# Patient Record
Sex: Female | Born: 1975 | Race: White | Hispanic: No | Marital: Married | Smoking: Never smoker
Health system: Southern US, Community
[De-identification: ages and names within clinical notes are randomized; demographics above are authoritative.]

## PROBLEM LIST (undated history)

## (undated) DIAGNOSIS — F32A Depression, unspecified: Secondary | ICD-10-CM

## (undated) DIAGNOSIS — N750 Cyst of Bartholin's gland: Secondary | ICD-10-CM

## (undated) DIAGNOSIS — F329 Major depressive disorder, single episode, unspecified: Secondary | ICD-10-CM

## (undated) DIAGNOSIS — N907 Vulvar cyst: Secondary | ICD-10-CM

## (undated) DIAGNOSIS — K219 Gastro-esophageal reflux disease without esophagitis: Secondary | ICD-10-CM

## (undated) DIAGNOSIS — R519 Headache, unspecified: Secondary | ICD-10-CM

## (undated) DIAGNOSIS — Z862 Personal history of diseases of the blood and blood-forming organs and certain disorders involving the immune mechanism: Secondary | ICD-10-CM

## (undated) DIAGNOSIS — R87613 High grade squamous intraepithelial lesion on cytologic smear of cervix (HGSIL): Secondary | ICD-10-CM

## (undated) DIAGNOSIS — F411 Generalized anxiety disorder: Secondary | ICD-10-CM

## (undated) DIAGNOSIS — F419 Anxiety disorder, unspecified: Secondary | ICD-10-CM

## (undated) DIAGNOSIS — S92909A Unspecified fracture of unspecified foot, initial encounter for closed fracture: Secondary | ICD-10-CM

## (undated) DIAGNOSIS — N390 Urinary tract infection, site not specified: Secondary | ICD-10-CM

## (undated) DIAGNOSIS — R51 Headache: Secondary | ICD-10-CM

## (undated) HISTORY — DX: Generalized anxiety disorder: F41.1

## (undated) HISTORY — DX: Unspecified fracture of unspecified foot, initial encounter for closed fracture: S92.909A

## (undated) HISTORY — DX: Cyst of Bartholin's gland: N75.0

## (undated) HISTORY — DX: Headache: R51

## (undated) HISTORY — DX: Headache, unspecified: R51.9

## (undated) HISTORY — DX: Major depressive disorder, single episode, unspecified: F32.9

## (undated) HISTORY — DX: Depression, unspecified: F32.A

## (undated) HISTORY — DX: Anxiety disorder, unspecified: F41.9

## (undated) HISTORY — DX: Gastro-esophageal reflux disease without esophagitis: K21.9

## (undated) HISTORY — DX: Vulvar cyst: N90.7

## (undated) HISTORY — DX: Personal history of diseases of the blood and blood-forming organs and certain disorders involving the immune mechanism: Z86.2

## (undated) HISTORY — DX: Urinary tract infection, site not specified: N39.0

---

## 2002-01-15 ENCOUNTER — Other Ambulatory Visit: Admission: RE | Admit: 2002-01-15 | Discharge: 2002-01-15 | Payer: Self-pay | Admitting: Unknown Physician Specialty

## 2008-12-31 ENCOUNTER — Encounter: Payer: Self-pay | Admitting: Obstetrics & Gynecology

## 2008-12-31 ENCOUNTER — Ambulatory Visit (HOSPITAL_COMMUNITY): Admission: RE | Admit: 2008-12-31 | Discharge: 2008-12-31 | Payer: Self-pay | Admitting: Obstetrics & Gynecology

## 2009-04-30 ENCOUNTER — Emergency Department (HOSPITAL_COMMUNITY): Admission: EM | Admit: 2009-04-30 | Discharge: 2009-04-30 | Payer: Self-pay | Admitting: Emergency Medicine

## 2009-09-06 ENCOUNTER — Emergency Department (HOSPITAL_COMMUNITY): Admission: EM | Admit: 2009-09-06 | Discharge: 2009-09-06 | Payer: Self-pay | Admitting: Family Medicine

## 2010-05-02 ENCOUNTER — Inpatient Hospital Stay (HOSPITAL_COMMUNITY): Admission: AD | Admit: 2010-05-02 | Discharge: 2010-05-02 | Payer: Self-pay | Admitting: Obstetrics & Gynecology

## 2010-05-28 ENCOUNTER — Other Ambulatory Visit: Admission: RE | Admit: 2010-05-28 | Discharge: 2010-05-28 | Payer: Self-pay | Admitting: Obstetrics & Gynecology

## 2010-05-28 ENCOUNTER — Ambulatory Visit (HOSPITAL_COMMUNITY): Admission: RE | Admit: 2010-05-28 | Discharge: 2010-05-28 | Payer: Self-pay | Admitting: Family Medicine

## 2010-06-14 ENCOUNTER — Ambulatory Visit (HOSPITAL_COMMUNITY)
Admission: RE | Admit: 2010-06-14 | Discharge: 2010-06-14 | Payer: Self-pay | Source: Home / Self Care | Admitting: Family Medicine

## 2010-09-19 HISTORY — PX: CYSTECTOMY: SHX5119

## 2010-10-06 LAB — URINALYSIS, ROUTINE W REFLEX MICROSCOPIC
Bilirubin Urine: NEGATIVE
Hgb urine dipstick: NEGATIVE
Ketones, ur: NEGATIVE mg/dL
Nitrite: NEGATIVE
Protein, ur: NEGATIVE mg/dL
Specific Gravity, Urine: 1.02 (ref 1.005–1.030)
Urine Glucose, Fasting: NEGATIVE mg/dL
Urobilinogen, UA: 0.2 mg/dL (ref 0.0–1.0)
pH: 7.5 (ref 5.0–8.0)

## 2010-10-06 LAB — CBC
HCT: 38.3 % (ref 36.0–46.0)
Hemoglobin: 13.2 g/dL (ref 12.0–15.0)
MCH: 30.2 pg (ref 26.0–34.0)
MCHC: 34.5 g/dL (ref 30.0–36.0)
MCV: 87.6 fL (ref 78.0–100.0)
Platelets: 281 10*3/uL (ref 150–400)
RBC: 4.37 MIL/uL (ref 3.87–5.11)
RDW: 12.5 % (ref 11.5–15.5)
WBC: 6.3 10*3/uL (ref 4.0–10.5)

## 2010-10-06 LAB — COMPREHENSIVE METABOLIC PANEL
ALT: 12 U/L (ref 0–35)
AST: 20 U/L (ref 0–37)
Albumin: 4.3 g/dL (ref 3.5–5.2)
Alkaline Phosphatase: 43 U/L (ref 39–117)
BUN: 12 mg/dL (ref 6–23)
CO2: 27 mEq/L (ref 19–32)
Calcium: 9.8 mg/dL (ref 8.4–10.5)
Chloride: 105 mEq/L (ref 96–112)
Creatinine, Ser: 0.64 mg/dL (ref 0.4–1.2)
GFR calc Af Amer: >60 mL/min (ref >60)
GFR calc non Af Amer: >60 mL/min (ref >60)
Glucose, Bld: 76 mg/dL (ref 70–99)
Potassium: 4 mEq/L (ref 3.5–5.1)
Sodium: 140 mEq/L (ref 135–145)
Total Bilirubin: 0.4 mg/dL (ref 0.3–1.2)
Total Protein: 7.1 g/dL (ref 6.0–8.3)

## 2010-10-06 LAB — HCG, QUANTITATIVE, PREGNANCY: hCG, Beta Chain, Quant, S: <2 m[IU]/mL (ref <5)

## 2010-10-06 LAB — SURGICAL PCR SCREEN
MRSA, PCR: NEGATIVE
Staphylococcus aureus: NEGATIVE

## 2010-10-13 ENCOUNTER — Ambulatory Visit (HOSPITAL_COMMUNITY)
Admission: RE | Admit: 2010-10-13 | Discharge: 2010-10-13 | Payer: Self-pay | Source: Home / Self Care | Attending: Obstetrics & Gynecology | Admitting: Obstetrics & Gynecology

## 2010-11-02 NOTE — Op Note (Signed)
  Stacey Walton, Stacey Walton               ACCOUNT NO.:  [id number]  MEDICAL RECORD NO.:  [id number]          PATIENT TYPE:  AMB  LOCATION:  DAY                           FACILITY:  [facility]  PHYSICIAN:  Lazaro Arms, M.D.   DATE OF BIRTH:  [birth year]  DATE OF PROCEDURE:  10/13/2010 DATE OF DISCHARGE:  10/13/2010                              OPERATIVE REPORT   PREOPERATIVE DIAGNOSIS:  Recurrent left vulvar cyst.  POSTOPERATIVE DIAGNOSIS:  Recurrent left vulvar cyst.  PROCEDURE:  Left vulvar cyst excision with partial vulvectomy.  SURGEON:  Lazaro Arms, M.D.  ANESTHESIA:  General endotracheal anesthesia.  FINDINGS:  The patient in April of 2010 had a very large cyst of the vulva.  It was quite soft and watery, and I felt like it was a canal of no remnant cyst over the past 8 months or so the patient has developed a cyst more in the lower part with seem like a Bartholin.  However, when I got in there today, I think it was just more of some sort of vulvar remnant cyst, it did not dissect out like a Bartholin gland cyst and it was not infected.  DESCRIPTION OF OPERATION:  The patient was taken to the operating room, placed in the supine position where she underwent laryngeal mask airway. She was then placed in the lithotomy position, prepped and draped in the usual sterile fashion.  Incision was made over the left vulvar cyst, dissection was performed with scissors, and the cyst was removed completely intact.  Both manual and sharp dissection was performed, the branch of pudendal artery was encountered and it was tied off with double figure-of-eight sutures.  The cyst was removed in total.  A 3- layer closure was then performed for hemostasis and for defect closure. The procedure anatomically was good and was hemostatic.  The bladder was drained.  A pudendal block was placed.  The patient was awaken from anesthesia, taken to recovery room in good stable addition.  All counts were  correct.  She received Ancef and Toradol prophylactically.     Lazaro Arms, M.D.     Loraine Maple  D:  10/13/2010  T:  10/14/2010  Job:  [id number]  Electronically Signed by Duane Lope M.D. on 11/02/2010 04:58:51 PM

## 2010-12-29 LAB — URINE MICROSCOPIC-ADD ON

## 2010-12-29 LAB — COMPREHENSIVE METABOLIC PANEL
ALT: 14 U/L (ref 0–35)
AST: 19 U/L (ref 0–37)
Albumin: 4.2 g/dL (ref 3.5–5.2)
Alkaline Phosphatase: 47 U/L (ref 39–117)
BUN: 10 mg/dL (ref 6–23)
CO2: 27 mEq/L (ref 19–32)
Calcium: 9.7 mg/dL (ref 8.4–10.5)
Chloride: 107 mEq/L (ref 96–112)
Creatinine, Ser: 0.63 mg/dL (ref 0.4–1.2)
GFR calc Af Amer: >60 mL/min (ref >60)
GFR calc non Af Amer: >60 mL/min (ref >60)
Glucose, Bld: 87 mg/dL (ref 70–99)
Potassium: 4.4 mEq/L (ref 3.5–5.1)
Sodium: 139 mEq/L (ref 135–145)
Total Bilirubin: 0.7 mg/dL (ref 0.3–1.2)
Total Protein: 7.2 g/dL (ref 6.0–8.3)

## 2010-12-29 LAB — URINALYSIS, ROUTINE W REFLEX MICROSCOPIC
Bilirubin Urine: NEGATIVE
Glucose, UA: NEGATIVE mg/dL
Ketones, ur: NEGATIVE mg/dL
Leukocytes, UA: NEGATIVE
Nitrite: NEGATIVE
Protein, ur: NEGATIVE mg/dL
Specific Gravity, Urine: 1.015 (ref 1.005–1.030)
Urobilinogen, UA: 0.2 mg/dL (ref 0.0–1.0)
pH: 8 (ref 5.0–8.0)

## 2010-12-29 LAB — CBC
HCT: 38.6 % (ref 36.0–46.0)
Hemoglobin: 13.4 g/dL (ref 12.0–15.0)
MCHC: 34.6 g/dL (ref 30.0–36.0)
MCV: 87.8 fL (ref 78.0–100.0)
Platelets: 250 10*3/uL (ref 150–400)
RBC: 4.4 MIL/uL (ref 3.87–5.11)
RDW: 13.6 % (ref 11.5–15.5)
WBC: 5.9 10*3/uL (ref 4.0–10.5)

## 2010-12-29 LAB — HCG, QUANTITATIVE, PREGNANCY: hCG, Beta Chain, Quant, S: <2 m[IU]/mL (ref <5)

## 2011-02-01 NOTE — Op Note (Signed)
NAME:  Stacey Walton, Stacey Walton              ACCOUNT NO.:  [id number]   MEDICAL RECORD NO.:  [id number]          PATIENT TYPE:  AMB   LOCATION:  DAY                           FACILITY:  [facility]   PHYSICIAN:  Lazaro Arms, M.D.   DATE OF BIRTH:  1975-11-06   DATE OF PROCEDURE:  12/31/2008  DATE OF DISCHARGE:                               OPERATIVE REPORT   PREOPERATIVE DIAGNOSIS:  Left vulvar cyst.   POSTOPERATIVE DIAGNOSIS:  Left vulvar cyst except she had 2 left vulvar  cysts, one appeared to be a cyst of the canal of Nuck and the other one  looked to be may be a rudimentary Bartholin's, kind of hard to say was  it infected, but the primary cyst was a canal of Nuck cyst.   PROCEDURE:  Excision of vulvar cysts.   SURGEON:  Lazaro Arms, MD   ANESTHESIA:  Laryngeal mask airway.   FINDINGS:  The patient was known preoperatively to have an expanding  vulvar cyst intraoperatively.  It was smooth-walled and noninfected and  it had mucoid drainage.  She also had another small cyst, but separate  from that I could not tell if it was a Bartholin's or not, but it was in  that area, but that was not distinct, that was not the primary driver of  the surgery.   DESCRIPTION OF OPERATION:  The patient was taken to operating room,  placed in supine position where she underwent general laryngeal mask  airway, placed in dorsal supine position, prepped and draped in usual  sterile fashion.  Incision was made in the left vulva, it was dissected  sharply and bluntly, dissected the cyst out, and also the one inferior  to it.  It was unfortunately during the procedure I while dissecting it  medially, it was such thin tissue, I was trying not to cut the vagina,  but the cyst opened up and it was pure mucoid material filling it.  It  was dissected out completely as was the other cyst, which was probably  rudimentary Bartholin's, but it was enlarged and I closed the space up  in 3 layers, first with 0-Vicryl  and then 2 layers of 3-0 Monocryl.  The  incision of the medial side of the vulva and the vagina was also closed  with Monocryl without difficulty, came very close to the urethra as the  cyst really filled the entire left vulva.  There was good hemostasis  throughout the procedure.  The blood loss was 250 mL.  She received 1 g  Ancef prophylactically.  She was taken to recovery room in good stable  condition.  All counts were correct.      Lazaro Arms, M.D.  Electronically Signed    LHE/MEDQ  D:  12/31/2008  T:  01/01/2009  Job:  [id number]

## 2011-02-20 ENCOUNTER — Emergency Department (HOSPITAL_COMMUNITY): Payer: 59

## 2011-02-20 ENCOUNTER — Emergency Department (HOSPITAL_COMMUNITY)
Admission: EM | Admit: 2011-02-20 | Discharge: 2011-02-20 | Disposition: A | Discharge status: Home / Self Care | Payer: 59 | Attending: Emergency Medicine | Admitting: Emergency Medicine

## 2011-02-20 DIAGNOSIS — M7989 Other specified soft tissue disorders: Secondary | ICD-10-CM | POA: Insufficient documentation

## 2011-02-20 DIAGNOSIS — M79609 Pain in unspecified limb: Secondary | ICD-10-CM | POA: Insufficient documentation

## 2011-02-20 DIAGNOSIS — L03019 Cellulitis of unspecified finger: Secondary | ICD-10-CM | POA: Insufficient documentation

## 2011-08-30 ENCOUNTER — Other Ambulatory Visit (HOSPITAL_COMMUNITY)
Admission: RE | Admit: 2011-08-30 | Discharge: 2011-08-30 | Disposition: A | Discharge status: Home / Self Care | Payer: 59 | Source: Ambulatory Visit | Attending: Obstetrics & Gynecology | Admitting: Obstetrics & Gynecology

## 2011-08-30 ENCOUNTER — Other Ambulatory Visit: Payer: Self-pay | Admitting: Obstetrics & Gynecology

## 2011-08-30 DIAGNOSIS — Z01419 Encounter for gynecological examination (general) (routine) without abnormal findings: Secondary | ICD-10-CM | POA: Insufficient documentation

## 2011-12-06 ENCOUNTER — Other Ambulatory Visit: Payer: Self-pay | Admitting: Obstetrics & Gynecology

## 2011-12-06 DIAGNOSIS — D1720 Benign lipomatous neoplasm of skin and subcutaneous tissue of unspecified limb: Secondary | ICD-10-CM

## 2011-12-09 ENCOUNTER — Ambulatory Visit (HOSPITAL_COMMUNITY): Payer: 59

## 2012-01-04 ENCOUNTER — Ambulatory Visit (HOSPITAL_COMMUNITY)
Admission: RE | Admit: 2012-01-04 | Discharge: 2012-01-04 | Disposition: A | Discharge status: Home / Self Care | Payer: 59 | Source: Ambulatory Visit | Attending: Obstetrics & Gynecology | Admitting: Obstetrics & Gynecology

## 2012-01-04 ENCOUNTER — Other Ambulatory Visit: Payer: Self-pay | Admitting: Obstetrics & Gynecology

## 2012-01-04 DIAGNOSIS — D1720 Benign lipomatous neoplasm of skin and subcutaneous tissue of unspecified limb: Secondary | ICD-10-CM

## 2012-01-04 DIAGNOSIS — N63 Unspecified lump in unspecified breast: Secondary | ICD-10-CM | POA: Insufficient documentation

## 2012-01-08 ENCOUNTER — Emergency Department (HOSPITAL_COMMUNITY)
Admission: EM | Admit: 2012-01-08 | Discharge: 2012-01-08 | Disposition: A | Discharge status: Home / Self Care | Payer: 59 | Source: Home / Self Care | Attending: Emergency Medicine | Admitting: Emergency Medicine

## 2012-01-08 ENCOUNTER — Encounter (HOSPITAL_COMMUNITY): Payer: Self-pay | Admitting: *Deleted

## 2012-01-08 DIAGNOSIS — J069 Acute upper respiratory infection, unspecified: Secondary | ICD-10-CM

## 2012-01-08 DIAGNOSIS — J019 Acute sinusitis, unspecified: Secondary | ICD-10-CM

## 2012-01-08 MED ORDER — AMOXICILLIN-POT CLAVULANATE 875-125 MG PO TABS: 1.0000 | ORAL_TABLET | Freq: Two times a day (BID) | ORAL | Status: AC

## 2012-01-08 NOTE — ED Provider Notes (Signed)
Chief Complaint  Patient presents with  . Nasal Congestion  . Cough    History of Present Illness:   [name] is a 36 year old female who has had an 8 day history of nasal congestion with green drainage, headache, cough productive of thick green sputum, tightness in chest, and sore throat. She denies fever, chills, sinus pain or pressure, earache, wheezing, or GI symptoms. She has no history of allergies or asthma.  Review of Systems:  Other than noted above, the patient denies any of the following symptoms. Systemic:  No fever, chills, sweats, fatigue, myalgias, headache, or anorexia. Eye:  No redness,pain or drainage. ENT:  No earache, ear congestion, nasal congestion, sneezing, rhinorrhea, sinus pressure, sinus pain, post nasal drip, or sore throat. Lungs:  No cough, sputum production, wheezing, shortness of breath. Or chest pain. Skin:  No rash or itching.  PMFSH:  Past medical history, family history, social history, meds, and allergies were reviewed.  Physical Exam:   Vital signs:  BP 120/80  Pulse 57  Temp(Src) 98.5 F (36.9 C) (Oral)  Resp 15  SpO2 98%  LMP 01/03/2012 General:  Alert, in no distress. Eye:  No conjunctival injection or drainage. Lids were normal. ENT:  TMs and canals were normal, without erythema or inflammation.  Nasal mucosa was clear and uncongested, without drainage.  Mucous membranes were moist.  Pharynx was clear, without exudate or drainage.  There were no oral ulcerations or lesions. Neck:  Supple, no adenopathy, tenderness or mass. Lungs:  No respiratory distress.  Lungs were clear to auscultation, without wheezes, rales or rhonchi.  Breath sounds were clear and equal bilaterally. Heart:  Regular rhythm, without gallops, murmers or rubs. Skin:  Clear, warm, and dry, without rash or lesions.  Assessment:  The primary encounter diagnosis was Viral upper respiratory infection. A diagnosis of Acute sinus infection was also pertinent to this  visit.  Plan:   1.  The following meds were prescribed:   New Prescriptions   AMOXICILLIN-CLAVULANATE (AUGMENTIN) 875-125 MG PER TABLET    Take 1 tablet by mouth 2 (two) times daily.   2.  The patient was instructed in symptomatic care and handouts were given. 3.  The patient was told to return if becoming worse in any way, if no better in 3 or 4 days, and given some red flag symptoms that would indicate earlier return.   Reuben Likes, MD 01/08/12 1351

## 2012-01-08 NOTE — ED Notes (Signed)
Pt with c/o sinus congestion/headache  and productive cough x one week - denies fevers

## 2012-01-08 NOTE — Discharge Instructions (Signed)

## 2012-04-26 ENCOUNTER — Encounter: Payer: Self-pay | Admitting: Family Medicine

## 2012-04-26 ENCOUNTER — Ambulatory Visit (INDEPENDENT_AMBULATORY_CARE_PROVIDER_SITE_OTHER): Payer: 59 | Admitting: Family Medicine

## 2012-04-26 VITALS — BP 149/81 | HR 56 | Temp 96.7°F | Ht 69.0 in | Wt 156.0 lb

## 2012-04-26 DIAGNOSIS — F411 Generalized anxiety disorder: Secondary | ICD-10-CM

## 2012-04-26 MED ORDER — CLONAZEPAM 0.5 MG PO TABS: ORAL_TABLET | ORAL | Status: DC

## 2012-04-26 MED ORDER — PAROXETINE HCL 20 MG PO TABS: ORAL_TABLET | ORAL | Status: DC

## 2012-04-26 NOTE — Progress Notes (Signed)
Office Note 04/26/2012  CC:  Chief Complaint  Patient presents with  . Establish Care    nerve issues    HPI:  Stacey Walton is a 36 y.o. White female who is here to establish care. Patient's most recent primary MD:  [facility] in [place], [place]. Old records in [facility] EMR were reviewed prior to or during today's visit.  About 1 yr hx of anxiety and depression.  Wellbutrin from previous MD didn't help and it caused insomnia.   Says for the last couple of months things have been worseing: describes frequent crying spells, worries about everything, irritable and tense, sometimes gets so emotional she feels overwhelmed/panicky.  Wants to isolate.  Tends to sleep more than usual as a withdrawal behavior.  Can't concentrate well.  Restless. Appetite unchanged.  Energy level down just a little.  Has a 20 y/o stepdaughter at home and 94 y/o stepson at home.   This is affecting her relationship some with her husband.  She has not had to miss any work and her kids have not been impacted by it that she knows of.  Past Medical History  Diagnosis Date  . Bartholin gland cyst     Recurrent (left)  . Vulvar cyst     Excised 2012  . History of anemia   . Headache disorder     Tension in neck, occiput, tense shoulders.  No migrainous features.  Takes motrin and/or tylenol and it helps usually.  MRI neck and brain in the past normal (2009)    Past Surgical History  Procedure Date  . Cesarean section   . Cystectomy 2012    Vulvar (Dr. Despina Hidden)    Family History  Problem Relation Age of Onset  . Hypertension Father   . Transient ischemic attack Father     History   Social History  . Marital Status: Married    Spouse Name: N/A    Number of Children: N/A  . Years of Education: N/A   Occupational History  . Not on file.   Social History Main Topics  . Smoking status: Never Smoker   . Smokeless tobacco: Never Used  . Alcohol Use: No  . Drug Use: No  . Sexually  Active: Not on file   Other Topics Concern  . Not on file   Social History Narrative   Divorced, remarried, has one teenage son.She has an associates degree in nursing and works at [employer].No T/A/Ds.No significant exercise.Normal american diet.    Outpatient Encounter Prescriptions as of 04/26/2012  Medication Sig Dispense Refill  . LOESTRIN 24 FE 1-20 MG-MCG tablet Take 1 tablet by mouth Daily.      . clonazePAM (KLONOPIN) 0.5 MG tablet 1-2 tabs po q8h prn anxiety  15 tablet  1  . PARoxetine (PAXIL) 20 MG tablet 1 tab po qhs  30 tablet  1  . DISCONTD: ibuprofen (ADVIL,MOTRIN) 400 MG tablet Take 400 mg by mouth every 6 (six) hours as needed.      Marland Kitchen DISCONTD: loratadine (CLARITIN) 10 MG tablet Take 10 mg by mouth daily.      Marland Kitchen DISCONTD: Pseudoephedrine-DM-GG (SUDAFED COUGH PO) Take by mouth.        No Known Allergies  ROS Review of Systems  Constitutional: Negative for fever and fatigue.  HENT: Negative for congestion and sore throat.   Eyes: Negative for visual disturbance.  Respiratory: Negative for cough.   Cardiovascular: Negative for chest pain.  Gastrointestinal: Negative for nausea and abdominal  pain.  Genitourinary: Negative for dysuria.  Musculoskeletal: Negative for back pain and joint swelling.  Skin: Negative for rash.  Neurological: Positive for headaches (chronic/recurrent/stress-tension HA's). Negative for weakness.  Hematological: Negative for adenopathy.  Psychiatric/Behavioral: Negative for suicidal ideas.       See hpi    PE; Blood pressure 149/81, pulse 56, temperature 96.7 F (35.9 C), temperature source Temporal, height 5\' 9"  (1.753 m), weight 156 lb (70.761 kg), last menstrual period 03/24/2012, SpO2 99.00%. Gen: Alert, well appearing.  Patient is oriented to person, place, time, and situation. Affect: pleasant, gets tearful occasionally but never outright crying or losing control.   ENT: Ears: EACs clear, normal epithelium.  TMs with good light reflex and  landmarks bilaterally.  Eyes: no injection, icteris, swelling, or exudate.  EOMI, PERRLA. Nose: no drainage or turbinate edema/swelling.  No injection or focal lesion.  Mouth: lips without lesion/swelling.  Oral mucosa pink and moist.  Dentition intact and without obvious caries or gingival swelling.  Oropharynx without erythema, exudate, or swelling.  Neck - No masses or thyromegaly or limitation in range of motion CV: RRR, no m/r/g.   LUNGS: CTA bilat, nonlabored resps, good aeration in all lung fields. ABD: soft, NT, ND, BS normal.  No hepatospenomegaly or mass.  No bruits. EXT: no clubbing, cyanosis, or edema.   Pertinent labs:  none  ASSESSMENT AND PLAN:   New Patient:  GAD (generalized anxiety disorder) With prominent mood sx's that I think are secondary to her worsening anxiety disorder.  She does approach the panic level on occasion.  Start paxil 20mg  qd.  Therapeutic expectations and side effect profile of medication discussed today.  Patient's questions answered. Also start clonazepam 0.5mg , 1-2 q8h prn, #15, RF x 1.  Therapeutic expectations and side effect profile of medication discussed today.  Patient's questions answered. Discussed the possibility of counseling today and she says she'll think about it. F/u 1 mo in office, earlier if problems.    Return in about 1 month (around 05/27/2012) for f/u anxiety and depression.

## 2012-04-26 NOTE — Assessment & Plan Note (Addendum)
With prominent mood sx's that I think are secondary to her worsening anxiety disorder.  She does approach the panic level on occasion.  Start paxil 20mg  qd.  Therapeutic expectations and side effect profile of medication discussed today.  Patient's questions answered. Also start clonazepam 0.5mg , 1-2 q8h prn, #15, RF x 1.  Therapeutic expectations and side effect profile of medication discussed today.  Patient's questions answered. Discussed the possibility of counseling today and she says she'll think about it. F/u 1 mo in office, earlier if problems.

## 2012-05-31 ENCOUNTER — Ambulatory Visit (INDEPENDENT_AMBULATORY_CARE_PROVIDER_SITE_OTHER): Payer: 59 | Admitting: Family Medicine

## 2012-05-31 ENCOUNTER — Encounter: Payer: Self-pay | Admitting: Family Medicine

## 2012-05-31 VITALS — BP 125/78 | HR 66 | Ht 69.0 in | Wt 153.0 lb

## 2012-05-31 DIAGNOSIS — F411 Generalized anxiety disorder: Secondary | ICD-10-CM

## 2012-05-31 MED ORDER — PAROXETINE HCL 20 MG PO TABS: ORAL_TABLET | ORAL | Status: DC

## 2012-05-31 NOTE — Assessment & Plan Note (Signed)
Much improved.  Continue paxil 20mg  qd.  She has clonazepam to use prn but she has not needed this. F/u 6 mo.

## 2012-05-31 NOTE — Progress Notes (Signed)
OFFICE NOTE  05/31/2012  CC:  Chief Complaint  Patient presents with  . Follow-up    GAD     HPI: Patient is a 36 y.o. Caucasian female who is here for 1 mo f/u GAD, started paxil 1 mo ago plus prn clonazepam.  Doing well, improved 75% per her estimate.  She felt foggy mentally quite a bit for the first week on the med.  This has tapered off gradually and she feels no side effect now at all.  Initially had appetite suppression but this has worn off as well.  Pertinent PMH:  Past Medical History  Diagnosis Date  . Bartholin gland cyst     Recurrent (left)  . Vulvar cyst     Excised 2012  . History of anemia   . Headache disorder     Tension in neck, occiput, tense shoulders.  No migrainous features.  Takes motrin and/or tylenol and it helps usually.  MRI neck and brain in the past normal (2009)    MEDS:  Outpatient Prescriptions Prior to Visit  Medication Sig Dispense Refill  . LOESTRIN 24 FE 1-20 MG-MCG tablet Take 1 tablet by mouth Daily.      Marland Kitchen PARoxetine (PAXIL) 20 MG tablet 1 tab po qhs  30 tablet  1  . clonazePAM (KLONOPIN) 0.5 MG tablet 1-2 tabs po q8h prn anxiety  15 tablet  1    PE: Blood pressure 125/78, pulse 66, height 5\' 9"  (1.753 m), weight 153 lb (69.4 kg). Wt Readings from Last 2 Encounters:  05/31/12 153 lb (69.4 kg)  04/26/12 156 lb (70.761 kg)    Gen: alert, oriented x 4, affect pleasant.  Lucid thinking and conversation noted. HEENT: PERRLA, EOMI.   Neck: no LAD, mass, or thyromegaly. CV: RRR, no m/r/g LUNGS: CTA bilat, nonlabored. NEURO: no tremor or tics noted on observation.  Coordination intact. CN 2-12 grossly intact bilaterally, strength 5/5 in all extremeties.  No ataxia.   IMPRESSION AND PLAN:  GAD (generalized anxiety disorder) Much improved.  Continue paxil 20mg  qd.  She has clonazepam to use prn but she has not needed this. F/u 6 mo.     FOLLOW UP: 6 mo

## 2012-11-28 ENCOUNTER — Ambulatory Visit: Payer: 59 | Admitting: Family Medicine

## 2012-12-06 ENCOUNTER — Encounter (HOSPITAL_COMMUNITY): Payer: Self-pay | Admitting: *Deleted

## 2012-12-06 ENCOUNTER — Emergency Department (HOSPITAL_COMMUNITY)
Admission: EM | Admit: 2012-12-06 | Discharge: 2012-12-06 | Disposition: A | Discharge status: Home / Self Care | Payer: 59 | Attending: Emergency Medicine | Admitting: Emergency Medicine

## 2012-12-06 ENCOUNTER — Ambulatory Visit: Payer: 59 | Admitting: Family Medicine

## 2012-12-06 ENCOUNTER — Emergency Department (HOSPITAL_COMMUNITY): Payer: 59

## 2012-12-06 DIAGNOSIS — K2900 Acute gastritis without bleeding: Secondary | ICD-10-CM | POA: Insufficient documentation

## 2012-12-06 DIAGNOSIS — N390 Urinary tract infection, site not specified: Secondary | ICD-10-CM | POA: Insufficient documentation

## 2012-12-06 DIAGNOSIS — R630 Anorexia: Secondary | ICD-10-CM | POA: Insufficient documentation

## 2012-12-06 DIAGNOSIS — Z862 Personal history of diseases of the blood and blood-forming organs and certain disorders involving the immune mechanism: Secondary | ICD-10-CM | POA: Insufficient documentation

## 2012-12-06 DIAGNOSIS — Z3202 Encounter for pregnancy test, result negative: Secondary | ICD-10-CM | POA: Insufficient documentation

## 2012-12-06 DIAGNOSIS — R112 Nausea with vomiting, unspecified: Secondary | ICD-10-CM | POA: Insufficient documentation

## 2012-12-06 DIAGNOSIS — Z8742 Personal history of other diseases of the female genital tract: Secondary | ICD-10-CM | POA: Insufficient documentation

## 2012-12-06 LAB — COMPREHENSIVE METABOLIC PANEL
AST: 16 U/L (ref 0–37)
Albumin: 3.5 g/dL (ref 3.5–5.2)
Alkaline Phosphatase: 48 U/L (ref 39–117)
Chloride: 100 mEq/L (ref 96–112)
Creatinine, Ser: 0.63 mg/dL (ref 0.50–1.10)
Potassium: 3 mEq/L — ABNORMAL LOW (ref 3.5–5.1)
Total Bilirubin: 0.4 mg/dL (ref 0.3–1.2)

## 2012-12-06 LAB — CBC WITH DIFFERENTIAL/PLATELET
Hemoglobin: 13.2 g/dL (ref 12.0–15.0)
Lymphocytes Relative: 18 % (ref 12–46)
Lymphs Abs: 0.7 10*3/uL (ref 0.7–4.0)
MCH: 29.4 pg (ref 26.0–34.0)
Monocytes Relative: 9 % (ref 3–12)
Neutro Abs: 2.8 10*3/uL (ref 1.7–7.7)
Neutrophils Relative %: 72 % (ref 43–77)
RBC: 4.49 MIL/uL (ref 3.87–5.11)
WBC: 3.9 10*3/uL — ABNORMAL LOW (ref 4.0–10.5)

## 2012-12-06 LAB — URINALYSIS, ROUTINE W REFLEX MICROSCOPIC
Glucose, UA: NEGATIVE mg/dL
Ketones, ur: NEGATIVE mg/dL
pH: 6 (ref 5.0–8.0)

## 2012-12-06 LAB — URINE MICROSCOPIC-ADD ON

## 2012-12-06 MED ORDER — SULFAMETHOXAZOLE-TRIMETHOPRIM 800-160 MG PO TABS: 1.0000 | ORAL_TABLET | Freq: Two times a day (BID) | ORAL | Status: DC

## 2012-12-06 MED ORDER — OMEPRAZOLE 20 MG PO CPDR: 20.0000 mg | DELAYED_RELEASE_CAPSULE | Freq: Every day | ORAL | Status: DC

## 2012-12-06 MED ORDER — ALUM & MAG HYDROXIDE-SIMETH 500-450-40 MG/5ML PO SUSP: 10.0000 mL | Freq: Three times a day (TID) | ORAL | Status: DC

## 2012-12-06 NOTE — ED Provider Notes (Signed)
History     This chart was scribed for Stacey Human, MD, MD by Smitty Pluck, ED Scribe. The patient was seen in room APA19[facility] and the patient's care was started at 2:23 PM.   CSN: [id number]  Arrival date & time 12/06/12  1316      Chief Complaint  Patient presents with  . Abdominal Pain    The history is provided by the patient and medical records. No language interpreter was used.   Stacey Walton is a 37 y.o. female who presents to the Emergency Department complaining of intermittent, severe abdominal pain onset 1 day ago but worsening today. She states the pain comes every 15-20 minutes. Pt reports that she had constant, nausea and emesis. Pt mentions taking zantac without relief of abdominal pain. She states she recently started a new diet. She states she has decreased appetite. Pt denies fever, dysuria, chills, diarrhea, weakness, cough, SOB, syncope and any other pain. Her LMP was 1.5 weeks ago and normal. She reports hx of c-section and vaginal cyst removed. She denies smoking cigarettes. She drinks alcohol occasionally.   Reports family (dad) hx of diverticulitis.   Past Medical History  Diagnosis Date  . Bartholin gland cyst     Recurrent (left)  . Vulvar cyst     Excised 2012  . History of anemia   . Headache disorder     Tension in neck, occiput, tense shoulders.  No migrainous features.  Takes motrin and/or tylenol and it helps usually.  MRI neck and brain in the past normal (2009)    Past Surgical History  Procedure Laterality Date  . Cesarean section    . Cystectomy  2012    Vulvar (Dr. Despina Hidden)    Family History  Problem Relation Age of Onset  . Hypertension Father   . Transient ischemic attack Father     History  Substance Use Topics  . Smoking status: Never Smoker   . Smokeless tobacco: Never Used  . Alcohol Use: Yes     Comment: rarely    OB History   Grav Para Term Preterm Abortions TAB SAB Ect Mult Living                  Review of  Systems  Constitutional: Negative for fever and chills.  Gastrointestinal: Positive for nausea, vomiting and abdominal pain. Negative for diarrhea.  All other systems reviewed and are negative.    Allergies  Review of patient's allergies indicates no known allergies.  Home Medications   Current Outpatient Rx  Name  Route  Sig  Dispense  Refill  . promethazine (PHENERGAN) 25 MG tablet   Oral   Take 25 mg by mouth once as needed for nausea.         . ranitidine (ZANTAC) 150 MG tablet   Oral   Take 150 mg by mouth daily as needed for heartburn (and/or acid reflux).           BP 127/80  Pulse 91  Temp(Src) 98.1 F (36.7 C) (Oral)  Resp 20  Ht 5\' 9"  (1.753 m)  Wt 160 lb (72.576 kg)  BMI 23.62 kg/m2  SpO2 100%  LMP 11/29/2012  Physical Exam  Nursing note and vitals reviewed. Constitutional: She is oriented to person, place, and time. She appears well-developed and well-nourished. No distress.  HENT:  Head: Normocephalic and atraumatic.  Eyes: Conjunctivae and EOM are normal. Pupils are equal, round, and reactive to light.  Neck: Normal range of  motion. Neck supple. No tracheal deviation present.  Cardiovascular: Normal rate, regular rhythm and normal heart sounds.   No murmur heard. Pulmonary/Chest: Effort normal and breath sounds normal. No respiratory distress. She has no wheezes. She has no rales.  Abdominal: Soft. Bowel sounds are normal. She exhibits no distension and no mass. There is tenderness in the epigastric area. There is no rebound and no guarding.  Musculoskeletal: Normal range of motion.  Neurological: She is alert and oriented to person, place, and time.  Skin: Skin is warm and dry.  Psychiatric: She has a normal mood and affect. Her behavior is normal.    ED Course  Procedures (including critical care time) DIAGNOSTIC STUDIES: Oxygen Saturation is 100% on room air, normal by my interpretation.    COORDINATION OF CARE: 2:31 PM Discussed ED  treatment with pt and pt agrees.   4:45 PM Results for orders placed during the hospital encounter of 12/06/12  COMPREHENSIVE METABOLIC PANEL      Result Value Range   Sodium 135  135 - 145 mEq/L   Potassium 3.0 (*) 3.5 - 5.1 mEq/L   Chloride 100  96 - 112 mEq/L   CO2 23  19 - 32 mEq/L   Glucose, Bld 111 (*) 70 - 99 mg/dL   BUN 8  6 - 23 mg/dL   Creatinine, Ser 1.61  0.50 - 1.10 mg/dL   Calcium 9.1  8.4 - 09.6 mg/dL   Total Protein 7.4  6.0 - 8.3 g/dL   Albumin 3.5  3.5 - 5.2 g/dL   AST 16  0 - 37 U/L   ALT 9  0 - 35 U/L   Alkaline Phosphatase 48  39 - 117 U/L   Total Bilirubin 0.4  0.3 - 1.2 mg/dL   GFR calc non Af Amer >90  >90 mL/min   GFR calc Af Amer >90  >90 mL/min  LIPASE, BLOOD      Result Value Range   Lipase 22  11 - 59 U/L  URINALYSIS, ROUTINE W REFLEX MICROSCOPIC      Result Value Range   Color, Urine YELLOW  YELLOW   APPearance CLEAR  CLEAR   Specific Gravity, Urine >1.030 (*) 1.005 - 1.030   pH 6.0  5.0 - 8.0   Glucose, UA NEGATIVE  NEGATIVE mg/dL   Hgb urine dipstick LARGE (*) NEGATIVE   Bilirubin Urine NEGATIVE  NEGATIVE   Ketones, ur NEGATIVE  NEGATIVE mg/dL   Protein, ur TRACE (*) NEGATIVE mg/dL   Urobilinogen, UA 0.2  0.0 - 1.0 mg/dL   Nitrite NEGATIVE  NEGATIVE   Leukocytes, UA TRACE (*) NEGATIVE  PREGNANCY, URINE      Result Value Range   Preg Test, Ur NEGATIVE  NEGATIVE  CBC WITH DIFFERENTIAL      Result Value Range   WBC 3.9 (*) 4.0 - 10.5 K/uL   RBC 4.49  3.87 - 5.11 MIL/uL   Hemoglobin 13.2  12.0 - 15.0 g/dL   HCT 04.5  40.9 - 81.1 %   MCV 87.1  78.0 - 100.0 fL   MCH 29.4  26.0 - 34.0 pg   MCHC 33.8  30.0 - 36.0 g/dL   RDW 91.4  78.2 - 95.6 %   Platelets 217  150 - 400 K/uL   Neutrophils Relative 72  43 - 77 %   Neutro Abs 2.8  1.7 - 7.7 K/uL   Lymphocytes Relative 18  12 - 46 %   Lymphs Abs 0.7  0.7 - 4.0 K/uL   Monocytes Relative 9  3 - 12 %   Monocytes Absolute 0.3  0.1 - 1.0 K/uL   Eosinophils Relative 1  0 - 5 %   Eosinophils  Absolute 0.0  0.0 - 0.7 K/uL   Basophils Relative 0  0 - 1 %   Basophils Absolute 0.0  0.0 - 0.1 K/uL  URINE MICROSCOPIC-ADD ON      Result Value Range   Squamous Epithelial / LPF FEW (*) RARE   WBC, UA 11-20  <3 WBC/hpf   RBC / HPF 11-20  <3 RBC/hpf   Bacteria, UA FEW (*) RARE   US Abdomen Limited Ruq  12/06/2012  *RADIOLOGY REPORT*  Clinical Data:  Epigastric abdominal pain.  LIMITED ABDOMINAL ULTRASOUND - RIGHT UPPER QUADRANT  Comparison:  None  Findings:  Gallbladder:  Normal sonographic appearance of the gallbladder.  No gallstones, wall thickening or pericholecystic fluid.  Negative sonographic Murphy's sign.  Common bile duct:  Normal in caliber measuring a maximum of 3.92mm.  Liver:  The liver is sonographically unremarkable.  There is normal echogenicity without focal lesions or intrahepatic biliary dilatation.  IMPRESSION: Unremarkable right upper quadrant abdominal ultrasound examination.                    Original Report Authenticated By: Rudie Meyer, M.D.     Lab workup showed a UTI.  Will Rx for UTI with Bactrim DS, and for acute gastritis with Prilosec 20 mg qd, and antacids pc and hs.  F/U with Earma Reading, M.D, her family physician.  No work for 3 days.         1. Acute gastritis   2. Urinary tract infection      I personally performed the services described in this documentation, which was scribed in my presence. The recorded information has been reviewed and is accurate.  Stacey Human, MD      Carleene Cooper III, MD 12/06/12 [phone]

## 2012-12-06 NOTE — ED Notes (Signed)
Epigastric diet,  N/v

## 2012-12-06 NOTE — ED Notes (Signed)
Pt state intermittent squeezing pain to epigastric area. States she vomited x 1 last night. NAD. Pt asked to hold off on EKG until seen by EMD stating, "I don't feel like this is cardiac related."

## 2012-12-08 LAB — URINE CULTURE

## 2012-12-09 ENCOUNTER — Telehealth (HOSPITAL_COMMUNITY): Payer: Self-pay | Admitting: Emergency Medicine

## 2012-12-09 NOTE — ED Notes (Signed)
Patient has +Urine culture. Checking to see if treated appropriately. °

## 2012-12-09 NOTE — ED Notes (Signed)
+  Urine. Patient treated with Septra. Sensitive to same. Per protocol MD. °

## 2013-02-26 ENCOUNTER — Encounter: Payer: 59 | Admitting: Physical Therapy

## 2013-06-10 ENCOUNTER — Encounter: Payer: Self-pay | Admitting: Internal Medicine

## 2013-06-10 ENCOUNTER — Encounter: Payer: Self-pay | Admitting: Family Medicine

## 2013-06-10 ENCOUNTER — Ambulatory Visit (INDEPENDENT_AMBULATORY_CARE_PROVIDER_SITE_OTHER): Payer: 59 | Admitting: Family Medicine

## 2013-06-10 VITALS — BP 123/76 | HR 60 | Temp 99.0°F | Resp 16 | Ht 69.0 in | Wt 169.0 lb

## 2013-06-10 DIAGNOSIS — R1013 Epigastric pain: Secondary | ICD-10-CM

## 2013-06-10 DIAGNOSIS — K219 Gastro-esophageal reflux disease without esophagitis: Secondary | ICD-10-CM

## 2013-06-10 DIAGNOSIS — K2970 Gastritis, unspecified, without bleeding: Secondary | ICD-10-CM

## 2013-06-10 LAB — CBC WITH DIFFERENTIAL/PLATELET
Basophils Relative: 0.6 % (ref 0.0–3.0)
Eosinophils Absolute: 0 10*3/uL (ref 0.0–0.7)
Eosinophils Relative: 0.6 % (ref 0.0–5.0)
Hemoglobin: 12.8 g/dL (ref 12.0–15.0)
MCHC: 33.5 g/dL (ref 30.0–36.0)
MCV: 88.2 fl (ref 78.0–100.0)
Monocytes Absolute: 0.3 10*3/uL (ref 0.1–1.0)
Neutro Abs: 3.5 10*3/uL (ref 1.4–7.7)
RBC: 4.33 Mil/uL (ref 3.87–5.11)

## 2013-06-10 LAB — COMPREHENSIVE METABOLIC PANEL
Albumin: 3.9 g/dL (ref 3.5–5.2)
Alkaline Phosphatase: 49 U/L (ref 39–117)
BUN: 12 mg/dL (ref 6–23)
CO2: 26 mEq/L (ref 19–32)
GFR: 109.52 mL/min (ref >60.00)
Glucose, Bld: 73 mg/dL (ref 70–99)
Potassium: 4.1 mEq/L (ref 3.5–5.1)
Total Bilirubin: 0.6 mg/dL (ref 0.3–1.2)

## 2013-06-10 MED ORDER — OMEPRAZOLE 20 MG PO CPDR: 20.0000 mg | DELAYED_RELEASE_CAPSULE | Freq: Every day | ORAL | Status: DC

## 2013-06-10 NOTE — Patient Instructions (Signed)
Take 60mg  dexilant cap twice per day (samples) until you are out of this, then start prilosec 20mg  once daily.

## 2013-06-10 NOTE — Assessment & Plan Note (Addendum)
VS esophagitis. Will check CBC, CMET, and h pylori ab today. Dexilant 60mg  caps samples given, 1 bid x 2 wks, then resume qd prilosec 20mg  qd. Refer to [facility] for further e/m, particularly since she has had chronic reflux requiring daily PPI.

## 2013-06-10 NOTE — Progress Notes (Signed)
OFFICE NOTE  06/10/2013  CC:  Chief Complaint  Patient presents with  . Flank Pain    R sided x tuesday     HPI: Patient is a 37 y.o. Caucasian female who is here for right flank pain. Onset 6d/a pain on right side of upper abdomen and intermittently in right side of chest, goes through to right side of back/scapula region.  Food/eating makes is worse, felt gassy, took tums and it helped some.  Has been taking prilosec bid since sx's started, plus mylanta some.  Sx's fluctating some.  No fever.  No vomiting or nausea.  Some loose stools assoc with mylanta intake, otherwise normal. She does have some classic GER and has been on prilosec daily for this.  No dysuria, no hematuria, no urgency or frequency.  No vag d/c or bleeding.  LMP about 3 and 1/2 wks ago.  Her husband has had a vasectomy.   No exertional CP, no cough, no SOB, no ST, no hoarseness.  Feeling better this morning. Had similar episode 11/2012 but she vomited with it that time so there was appropriate concern for cholecystitis but u/s at that time was normal.    Pertinent PMH:  Past Medical History  Diagnosis Date  . Bartholin gland cyst     Recurrent (left)  . Vulvar cyst     Excised 2012  . History of anemia   . Headache disorder     Tension in neck, occiput, tense shoulders.  No migrainous features.  Takes motrin and/or tylenol and it helps usually.  MRI neck and brain in the past normal (2009)   Past surgical, social, and family history reviewed and no changes noted since last office visit.   MEDS:  Outpatient Prescriptions Prior to Visit  Medication Sig Dispense Refill  . aluminum & magnesium hydroxide-simethicone (MYLANTA) 500-450-40 MG/5ML suspension Take 10 mLs by mouth 4 (four) times daily - after meals and at bedtime.  355 mL  0  . norethindrone-ethinyl estradiol (JUNEL FE,GILDESS FE,LOESTRIN FE) 1-20 MG-MCG tablet Take 1 tablet by mouth daily.      Marland Kitchen omeprazole (PRILOSEC) 20 MG capsule Take 1 capsule (20 mg  total) by mouth daily.  20 capsule  0  . promethazine (PHENERGAN) 25 MG tablet Take 25 mg by mouth once as needed for nausea.      . ranitidine (ZANTAC) 150 MG tablet Take 150 mg by mouth daily as needed for heartburn (and/or acid reflux).      Marland Kitchen sulfamethoxazole-trimethoprim (SEPTRA DS) 800-160 MG per tablet Take 1 tablet by mouth 2 (two) times daily.  14 tablet  0   No facility-administered medications prior to visit.  Not taking bactrim, phenergan, or zantac  PE: Blood pressure 123/76, pulse 60, temperature 99 F (37.2 C), temperature source Temporal, resp. rate 16, height 5\' 9"  (1.753 m), weight 169 lb (76.658 kg), SpO2 98.00%. Gen: Alert, well appearing.  Patient is oriented to person, place, time, and situation. Oropharynx: moist, pink, no pharyngeal erythema or swelling. Neck - No masses or thyromegaly or limitation in range of motion CV: RRR, no m/r/g.  Chest wall nontender. LUNGS: CTA bilat, nonlabored resps, good aeration in all lung fields. ABD: soft, NT, ND, BS normal.  No hepatospenomegaly or mass.  No bruits.  LAB: none today  IMPRESSION AND PLAN:  Gastritis VS esophagitis. Will check CBC, CMET, and h pylori ab today. Dexilant 60mg  caps samples given, 1 bid x 2 wks, then resume qd prilosec 20mg  qd. Refer to  [facility] for further e/m, particularly since she has had chronic reflux requiring daily PPI.   An After Visit Summary was printed and given to the patient.  FOLLOW UP: prn

## 2013-06-13 ENCOUNTER — Other Ambulatory Visit: Payer: 59

## 2013-06-13 ENCOUNTER — Other Ambulatory Visit: Payer: Self-pay | Admitting: Obstetrics and Gynecology

## 2013-06-13 DIAGNOSIS — Z309 Encounter for contraceptive management, unspecified: Secondary | ICD-10-CM

## 2013-07-08 ENCOUNTER — Encounter: Payer: Self-pay | Admitting: Internal Medicine

## 2013-07-08 ENCOUNTER — Ambulatory Visit (INDEPENDENT_AMBULATORY_CARE_PROVIDER_SITE_OTHER): Payer: 59 | Admitting: Internal Medicine

## 2013-07-08 VITALS — BP 100/68 | HR 72 | Ht 69.0 in | Wt 172.4 lb

## 2013-07-08 DIAGNOSIS — R079 Chest pain, unspecified: Secondary | ICD-10-CM

## 2013-07-08 DIAGNOSIS — K219 Gastro-esophageal reflux disease without esophagitis: Secondary | ICD-10-CM

## 2013-07-08 MED ORDER — OMEPRAZOLE 40 MG PO CPDR: 40.0000 mg | DELAYED_RELEASE_CAPSULE | Freq: Every day | ORAL | Status: DC

## 2013-07-08 NOTE — Patient Instructions (Addendum)
We have sent the following medications to your pharmacy for you to pick up at your convenience: Omeprazole 40 mg to take one tablet by mouth once daily.   You have been scheduled for an endoscopy with propofol. Please follow written instructions given to you at your visit today. If you use inhalers (even only as needed), please bring them with you on the day of your procedure. Your physician has requested that you go to www.startemmi.com and enter the access code given to you at your visit today. This web site gives a general overview about your procedure. However, you should still follow specific instructions given to you by our office regarding your preparation for the procedure.  cc: Nicoletta Ba, MD

## 2013-07-08 NOTE — Progress Notes (Signed)
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 Patient reports a several year history of reflux-type symptoms as manifested by pyrosis and regurgitation. Worse over the past year. Intermittently she has used antacids, H2 receptor antagonists, and currently PPIs. She also reports intermittent problems with chest pain which can last up to one week. This generally occurs about once every 6 months. The discomfort has been labeled reflux, though acid suppressive therapy does not seem to improve symptoms readily. She does have some belching and bloating. The discomfort as sharp. Not necessarily affected by meals. She also complains of daily nausea. Nausea is better on PPI. Recently had 2 weeks of Dexilant which seem more helpful than omeprazole 20 mg daily. She denies abdominal pain. Some constipation. GI review of systems is otherwise negative. She was evaluated in the emergency room for right-sided chest pain with radiation to the back in September. Blood work including CBC and comprehensive metabolic panel was unremarkable. Helicobacter pylori antibody was negative. Also, previous evaluation in the emergency room in March for similar complaints of chest pain. At that time, laboratories and urinalysis were unrevealing. Abdominal ultrasound was negative. Normal gallbladder.  REVIEW OF SYSTEMS:  All non-GI ROS negative upon review  Past Medical History  Diagnosis Date  . Bartholin gland cyst     Recurrent (left)  . Vulvar cyst     Excised 2012  . History of anemia   . Headache disorder     Tension in neck, occiput, tense shoulders.  No migrainous features.  Takes motrin and/or tylenol and it helps usually.  MRI neck and brain in the past normal (2009)  . GERD (gastroesophageal reflux disease)   . UTI (lower urinary tract  infection)     Past Surgical History  Procedure Laterality Date  . Cesarean section    . Cystectomy  2012    Vulvar (Dr. Despina Hidden)    Social History Stacey Walton  reports that she has never smoked. She has never used smokeless tobacco. She reports that she drinks alcohol. She reports that she does not use illicit drugs.  family history includes Hypertension in her father; Transient ischemic attack in her father.  No Known Allergies     PHYSICAL EXAMINATION: Vital signs: BP 100/68  Pulse 72  Ht 5\' 9"  (1.753 m)  Wt 172 lb 6 oz (78.189 kg)  BMI 25.44 kg/m2  LMP 06/17/2013  Constitutional: generally well-appearing, no acute distress Psychiatric: alert and oriented x3, cooperative Eyes: extraocular movements intact, anicteric, conjunctiva pink Mouth: oral pharynx moist, no lesions Neck: supple no lymphadenopathy Cardiovascular: heart regular rate and rhythm, no murmur Lungs: clear to auscultation bilaterally Abdomen: soft, nontender, nondistended, no obvious ascites, no peritoneal signs, normal bowel sounds, no organomegaly Rectal: Omitted Extremities: no lower extremity edema bilaterally Skin: no lesions on visible extremities Neuro: No focal deficits.   ASSESSMENT:  #1. GERD. Classic symptoms. Worse over the past year. Seems to do better on high-dose PPI #2. Chest pain. Etiology uncertain. Not sure if this is reflux. Could be spasm. Could be non-GI such as costochondritis   PLAN:  #1. Change from omeprazole 20 mg to omeprazole 40 mg daily. Prescribed #2. Reflux precautions #3. Diagnostic upper endoscopy to evaluate chronic reflux and chest pain.The nature of the procedure, as well as the risks, benefits, and alternatives were carefully and  thoroughly reviewed with the patient. Ample time for discussion and questions allowed. The patient understood, was satisfied, and agreed to proceed. #4. Could consider a problem of antispasmodic if above workup/therapeutic change does  not impact issues with chest pain

## 2013-07-09 ENCOUNTER — Encounter: Payer: Self-pay | Admitting: Internal Medicine

## 2013-07-25 ENCOUNTER — Other Ambulatory Visit: Payer: Self-pay

## 2013-07-29 ENCOUNTER — Encounter: Payer: Self-pay | Admitting: Internal Medicine

## 2013-07-29 ENCOUNTER — Ambulatory Visit (AMBULATORY_SURGERY_CENTER): Payer: 59 | Admitting: Internal Medicine

## 2013-07-29 VITALS — BP 123/78 | HR 71 | Temp 97.8°F | Resp 17 | Ht 69.0 in | Wt 172.0 lb

## 2013-07-29 DIAGNOSIS — K219 Gastro-esophageal reflux disease without esophagitis: Secondary | ICD-10-CM

## 2013-07-29 DIAGNOSIS — R079 Chest pain, unspecified: Secondary | ICD-10-CM

## 2013-07-29 MED ORDER — SODIUM CHLORIDE 0.9 % IV SOLN: 500.0000 mL | INTRAVENOUS | Status: DC

## 2013-07-29 NOTE — Progress Notes (Signed)
Patient did not experience any of the following events: a burn prior to discharge; a fall within the facility; wrong site/side/patient/procedure/implant event; or a hospital transfer or hospital admission upon discharge from the facility. (G8907) Patient did not have preoperative order for IV antibiotic SSI prophylaxis. (G8918)  

## 2013-07-29 NOTE — Progress Notes (Signed)
Procedure ends, to recovery, report given and VSS. 

## 2013-07-29 NOTE — Patient Instructions (Signed)

## 2013-07-29 NOTE — Op Note (Signed)
[facility] [address] [place], [address]   ENDOSCOPY PROCEDURE REPORT  PATIENT: Stacey, Walton  MR#: [id number] BIRTHDATE: 04/28/1976 , 37  yrs. old GENDER: Female ENDOSCOPIST: Roxy Cedar, MD REFERRED BY:  Earley Favor, M.D. PROCEDURE DATE:  07/29/2013 PROCEDURE:  EGD, diagnostic ASA CLASS:     Class I INDICATIONS:  Chest pain.   History of esophageal reflux. MEDICATIONS: MAC sedation, administered by CRNA and propofol (Diprivan) 200mg  IV TOPICAL ANESTHETIC: none  DESCRIPTION OF PROCEDURE: After the risks benefits and alternatives of the procedure were thoroughly explained, informed consent was obtained.  The LB [id number] endoscope was introduced through the mouth and advanced to the second portion of the duodenum. Without limitations.  The instrument was slowly withdrawn as the mucosa was fully examined.      The upper, middle and distal third of the esophagus were carefully inspected and no abnormalities were noted.  The z-line was well seen at the GEJ.  The endoscope was pushed into the fundus which was normal including a retroflexed view.  The antrum, gastric body, first and second part of the duodenum were unremarkable. Retroflexed views revealed no abnormalities.     The scope was then withdrawn from the patient and the procedure completed.  COMPLICATIONS: There were no complications. ENDOSCOPIC IMPRESSION: 1.Normal EGD 2. GERD  RECOMMENDATIONS: 1.  Anti-reflux regimen to be followed 2.  Continue PPI 3. Return to the care of your primary provider. GI followup as needed  REPEAT EXAM:  eSigned:  Roxy Cedar, MD 07/29/2013 3:57 PM   FA:OZHYQMV McGowen, MD and The Patient

## 2013-07-30 ENCOUNTER — Telehealth: Payer: Self-pay | Admitting: *Deleted

## 2013-07-30 NOTE — Telephone Encounter (Signed)
Left message on number given in admitting yesterday to return call if problems or concerns or questions. ewm

## 2013-10-30 ENCOUNTER — Telehealth: Payer: Self-pay

## 2013-10-30 DIAGNOSIS — K219 Gastro-esophageal reflux disease without esophagitis: Secondary | ICD-10-CM

## 2013-10-30 MED ORDER — OMEPRAZOLE 40 MG PO CPDR: 40.0000 mg | DELAYED_RELEASE_CAPSULE | Freq: Every day | ORAL | Status: DC

## 2013-10-30 NOTE — Telephone Encounter (Signed)
Sent 90 day rx for Omeprazole to [facility]

## 2013-10-30 NOTE — Telephone Encounter (Signed)
Sent 90 day supply of Omeprazole to pharamacy

## 2014-06-10 ENCOUNTER — Encounter: Payer: 59 | Admitting: Family Medicine

## 2014-06-10 ENCOUNTER — Ambulatory Visit (INDEPENDENT_AMBULATORY_CARE_PROVIDER_SITE_OTHER): Payer: 59 | Admitting: Obstetrics & Gynecology

## 2014-06-10 ENCOUNTER — Encounter: Payer: Self-pay | Admitting: Obstetrics & Gynecology

## 2014-06-10 ENCOUNTER — Other Ambulatory Visit (HOSPITAL_COMMUNITY)
Admission: RE | Admit: 2014-06-10 | Discharge: 2014-06-10 | Disposition: A | Discharge status: Home / Self Care | Payer: 59 | Source: Ambulatory Visit | Attending: Obstetrics & Gynecology | Admitting: Obstetrics & Gynecology

## 2014-06-10 VITALS — BP 120/80 | Ht 69.0 in | Wt 168.0 lb

## 2014-06-10 DIAGNOSIS — Z01419 Encounter for gynecological examination (general) (routine) without abnormal findings: Secondary | ICD-10-CM | POA: Diagnosis not present

## 2014-06-10 DIAGNOSIS — R87810 Cervical high risk human papillomavirus (HPV) DNA test positive: Secondary | ICD-10-CM | POA: Diagnosis present

## 2014-06-10 DIAGNOSIS — Z1151 Encounter for screening for human papillomavirus (HPV): Secondary | ICD-10-CM | POA: Insufficient documentation

## 2014-06-10 MED ORDER — NORETHIN ACE-ETH ESTRAD-FE 1-20 MG-MCG PO TABS
ORAL_TABLET | ORAL | Status: DC
Start: 2014-06-10 — End: 2015-12-31

## 2014-06-10 NOTE — Addendum Note (Signed)
Addended by: Farley Ly on: 06/10/2014 12:16 PM   Modules accepted: Orders

## 2014-06-10 NOTE — Progress Notes (Signed)
Patient ID: Stacey Walton, female   DOB: [birth year], 38 y.o.   MRN: [id number] Subjective:     Stacey Walton is a 38 y.o. female here for a routine exam.  Patient's last menstrual period was 05/21/2014. No obstetric history on file. Birth Control Method:  OCP Menstrual Calendar(currently): regular  Current complaints: none.   Current acute medical issues:  none   Recent Gynecologic History Patient's last menstrual period was 05/21/2014. Last Pap: 2013,  normal Last mammogram: 2013,  normal  Past Medical History  Diagnosis Date  . Bartholin gland cyst     Recurrent (left)  . Vulvar cyst     Excised 2012  . History of anemia   . Headache disorder     Tension in neck, occiput, tense shoulders.  No migrainous features.  Takes motrin and/or tylenol and it helps usually.  MRI neck and brain in the past normal (2009)  . GERD (gastroesophageal reflux disease)   . UTI (lower urinary tract infection)     Past Surgical History  Procedure Laterality Date  . Cesarean section    . Cystectomy  2012    Vulvar (Dr. Elonda Husky)    OB History   Grav Para Term Preterm Abortions TAB SAB Ect Mult Living                  History   Social History  . Marital Status: Married    Spouse Name: N/A    Number of Children: N/A  . Years of Education: N/A   Social History Main Topics  . Smoking status: Never Smoker   . Smokeless tobacco: Never Used  . Alcohol Use: Yes     Comment: rarely  . Drug Use: No  . Sexual Activity: Yes    Birth Control/ Protection: Pill   Other Topics Concern  . None   Social History Narrative   Divorced, remarried, has one teenage son.   She has an associates degree in nursing and works at [employer].   No T/A/Ds.   No significant exercise.   Normal american diet.             Family History  Problem Relation Age of Onset  . Hypertension Father   . Transient ischemic attack Father      Review of Systems  Review of Systems  Constitutional: Negative for  fever, chills, weight loss, malaise/fatigue and diaphoresis.  HENT: Negative for hearing loss, ear pain, nosebleeds, congestion, sore throat, neck pain, tinnitus and ear discharge.   Eyes: Negative for blurred vision, double vision, photophobia, pain, discharge and redness.  Respiratory: Negative for cough, hemoptysis, sputum production, shortness of breath, wheezing and stridor.   Cardiovascular: Negative for chest pain, palpitations, orthopnea, claudication, leg swelling and PND.  Gastrointestinal: negative for abdominal pain. Negative for heartburn, nausea, vomiting, diarrhea, constipation, blood in stool and melena.  Genitourinary: Negative for dysuria, urgency, frequency, hematuria and flank pain.  Musculoskeletal: Negative for myalgias, back pain, joint pain and falls.  Skin: Negative for itching and rash.  Neurological: Negative for dizziness, tingling, tremors, sensory change, speech change, focal weakness, seizures, loss of consciousness, weakness and headaches.  Endo/Heme/Allergies: Negative for environmental allergies and polydipsia. Does not bruise/bleed easily.  Psychiatric/Behavioral: Negative for depression, suicidal ideas, hallucinations, memory loss and substance abuse. The patient is not nervous/anxious and does not have insomnia.        Objective:    Physical Exam  Vitals reviewed. Constitutional: She is oriented to person, place, and time.  She appears well-developed and well-nourished.  HENT:  Head: Normocephalic and atraumatic.        Right Ear: External ear normal.  Left Ear: External ear normal.  Nose: Nose normal.  Mouth/Throat: Oropharynx is clear and moist.  Eyes: Conjunctivae and EOM are normal. Pupils are equal, round, and reactive to light. Right eye exhibits no discharge. Left eye exhibits no discharge. No scleral icterus.  Neck: Normal range of motion. Neck supple. No tracheal deviation present. No thyromegaly present.  Cardiovascular: Normal rate, regular  rhythm, normal heart sounds and intact distal pulses.  Exam reveals no gallop and no friction rub.   No murmur heard. Respiratory: Effort normal and breath sounds normal. No respiratory distress. She has no wheezes. She has no rales. She exhibits no tenderness.  GI: Soft. Bowel sounds are normal. She exhibits no distension and no mass. There is no tenderness. There is no rebound and no guarding.  Genitourinary:  Breasts no masses skin changes or nipple changes bilaterally      Vulva is normal without lesions Vagina is pink moist without discharge Cervix normal in appearance and pap is done Uterus is normal size shape and contour Adnexa is negative with normal sized ovaries   Musculoskeletal: Normal range of motion. She exhibits no edema and no tenderness.  Neurological: She is alert and oriented to person, place, and time. She has normal reflexes. She displays normal reflexes. No cranial nerve deficit. She exhibits normal muscle tone. Coordination normal.  Skin: Skin is warm and dry. No rash noted. No erythema. No pallor.  Psychiatric: She has a normal mood and affect. Her behavior is normal. Judgment and thought content normal.       Assessment:    Healthy female exam.    Plan:    Contraception: OCP (estrogen/progesterone). Mammogram ordered. Follow up in: 1 year.

## 2014-06-11 LAB — CYTOLOGY - PAP

## 2014-06-20 ENCOUNTER — Other Ambulatory Visit: Payer: Self-pay | Admitting: Obstetrics & Gynecology

## 2014-06-25 ENCOUNTER — Encounter: Payer: 59 | Admitting: Family Medicine

## 2014-07-04 ENCOUNTER — Other Ambulatory Visit: Payer: Self-pay

## 2014-07-11 ENCOUNTER — Other Ambulatory Visit: Payer: Self-pay | Admitting: Obstetrics and Gynecology

## 2014-07-18 ENCOUNTER — Other Ambulatory Visit: Payer: Self-pay | Admitting: Obstetrics & Gynecology

## 2014-07-18 DIAGNOSIS — Z1231 Encounter for screening mammogram for malignant neoplasm of breast: Secondary | ICD-10-CM

## 2014-07-28 ENCOUNTER — Ambulatory Visit (HOSPITAL_COMMUNITY)
Admission: RE | Admit: 2014-07-28 | Discharge: 2014-07-28 | Disposition: A | Discharge status: Home / Self Care | Payer: 59 | Source: Ambulatory Visit | Attending: Obstetrics & Gynecology | Admitting: Obstetrics & Gynecology

## 2014-07-28 DIAGNOSIS — Z1231 Encounter for screening mammogram for malignant neoplasm of breast: Secondary | ICD-10-CM | POA: Diagnosis not present

## 2014-08-07 ENCOUNTER — Encounter: Payer: Self-pay | Admitting: Family Medicine

## 2014-08-07 ENCOUNTER — Ambulatory Visit (INDEPENDENT_AMBULATORY_CARE_PROVIDER_SITE_OTHER): Payer: 59 | Admitting: Family Medicine

## 2014-08-07 VITALS — BP 113/77 | HR 59 | Temp 98.7°F | Resp 18 | Ht 69.0 in | Wt 172.0 lb

## 2014-08-07 DIAGNOSIS — Z0000 Encounter for general adult medical examination without abnormal findings: Secondary | ICD-10-CM | POA: Insufficient documentation

## 2014-08-07 DIAGNOSIS — F4323 Adjustment disorder with mixed anxiety and depressed mood: Secondary | ICD-10-CM

## 2014-08-07 LAB — CBC WITH DIFFERENTIAL/PLATELET
BASOS ABS: 0 10*3/uL (ref 0.0–0.1)
Basophils Relative: 0.7 % (ref 0.0–3.0)
EOS ABS: 0.1 10*3/uL (ref 0.0–0.7)
Eosinophils Relative: 0.9 % (ref 0.0–5.0)
HCT: 41.1 % (ref 36.0–46.0)
Hemoglobin: 13.4 g/dL (ref 12.0–15.0)
LYMPHS PCT: 38.8 % (ref 12.0–46.0)
Lymphs Abs: 2.4 10*3/uL (ref 0.7–4.0)
MCHC: 32.6 g/dL (ref 30.0–36.0)
MCV: 88.7 fl (ref 78.0–100.0)
MONOS PCT: 6.2 % (ref 3.0–12.0)
Monocytes Absolute: 0.4 10*3/uL (ref 0.1–1.0)
NEUTROS PCT: 53.4 % (ref 43.0–77.0)
Neutro Abs: 3.4 10*3/uL (ref 1.4–7.7)
Platelets: 304 10*3/uL (ref 150.0–400.0)
RBC: 4.63 Mil/uL (ref 3.87–5.11)
RDW: 13 % (ref 11.5–15.5)
WBC: 6.3 10*3/uL (ref 4.0–10.5)

## 2014-08-07 LAB — TSH: TSH: 0.85 u[IU]/mL (ref 0.35–4.50)

## 2014-08-07 MED ORDER — MELOXICAM 7.5 MG PO TABS: ORAL_TABLET | ORAL | Status: DC

## 2014-08-07 MED ORDER — CLONAZEPAM 0.5 MG PO TABS: 0.5000 mg | ORAL_TABLET | ORAL | Status: DC | PRN

## 2014-08-07 NOTE — Progress Notes (Signed)
Office Note 08/07/2014  CC:  Chief Complaint  Patient presents with  . Annual Exam    fasting, no pap needed    HPI:  Stacey Walton is a 38 y.o. White female who is here for fasting CPE.   Hx of adjustment d/o with anx/dep sx's, mainly to do with some struggles her son is going through and the angst it is causing her.  Klonopin prn helps, she is resistant to counseling at this time.  Asks for rf of mobic, says it helps with musculoskeletal pain that is worse in fall/winter.    She exercises fairly regularly.  Past Medical History  Diagnosis Date  . Bartholin gland cyst     Recurrent (left)  . Vulvar cyst     Excised 2012  . History of anemia   . Headache disorder     Tension in neck, occiput, tense shoulders.  No migrainous features.  Takes motrin and/or tylenol and it helps usually.  MRI neck and brain in the past normal (2009)  . GERD (gastroesophageal reflux disease)   . UTI (lower urinary tract infection)   . GAD (generalized anxiety disorder)     Past Surgical History  Procedure Laterality Date  . Cesarean section    . Cystectomy  2012    Vulvar (Dr. Elonda Husky)    Family History  Problem Relation Age of Onset  . Hypertension Father   . Transient ischemic attack Father     History   Social History  . Marital Status: Married    Spouse Name: N/A    Number of Children: N/A  . Years of Education: N/A   Occupational History  . Not on file.   Social History Main Topics  . Smoking status: Never Smoker   . Smokeless tobacco: Never Used  . Alcohol Use: Yes     Comment: rarely  . Drug Use: No  . Sexual Activity: Yes    Birth Control/ Protection: Pill   Other Topics Concern  . Not on file   Social History Narrative   Divorced, remarried, has one teenage son.   She has an associates degree in nursing and works at [employer].   No T/A/Ds.   No significant exercise.   Normal american diet.             Outpatient Prescriptions Prior to Visit   Medication Sig Dispense Refill  . aluminum & magnesium hydroxide-simethicone (MYLANTA) 500-450-40 MG/5ML suspension Take 10 mLs by mouth 4 (four) times daily - after meals and at bedtime. 355 mL 0  . norethindrone-ethinyl estradiol (JUNEL FE 1/20) 1-20 MG-MCG tablet TAKE 1 TABLET BY MOUTH ONCE DAILY 84 tablet 3  . omeprazole (PRILOSEC) 40 MG capsule Take 1 capsule (40 mg total) by mouth daily. 90 capsule 3  . meloxicam (MOBIC) 7.5 MG tablet     . JUNEL FE 1/20 1-20 MG-MCG tablet TAKE 1 TABLET BY MOUTH ONCE DAILY 84 tablet 3   No facility-administered medications prior to visit.    No Known Allergies  ROS Review of Systems  PE; Blood pressure 113/77, pulse 59, temperature 98.7 F (37.1 C), temperature source Temporal, resp. rate 18, height 5\' 9"  (1.753 m), weight 172 lb (78.019 kg), SpO2 100 %. Gen: Alert, well appearing.  Patient is oriented to person, place, time, and situation. AFFECT: pleasant, lucid thought and speech. ENT: Ears: EACs clear, normal epithelium.  TMs with good light reflex and landmarks bilaterally.  Eyes: no injection, icteris, swelling, or exudate.  EOMI,  PERRLA. Nose: no drainage or turbinate edema/swelling.  No injection or focal lesion.  Mouth: lips without lesion/swelling.  Oral mucosa pink and moist.  Dentition intact and without obvious caries or gingival swelling.  Oropharynx without erythema, exudate, or swelling.  Neck: supple/nontender.  No LAD, mass, or TM.  Carotid pulses 2+ bilaterally, without bruits. CV: RRR, no m/r/g.   LUNGS: CTA bilat, nonlabored resps, good aeration in all lung fields. ABD: soft, NT, ND, BS normal.  No hepatospenomegaly or mass.  No bruits. EXT: no clubbing, cyanosis, or edema.  Musculoskeletal: no joint swelling, erythema, warmth, or tenderness.  ROM of all joints intact. Skin - no sores or suspicious lesions or rashes or color changes   Pertinent labs:  none  ASSESSMENT AND PLAN:   Health maintenance  examination Reviewed age and gender appropriate health maintenance issues (prudent diet, regular exercise, health risks of tobacco and excessive alcohol, use of seatbelts, fire alarms in home, use of sunscreen).  Also reviewed age and gender appropriate health screening as well as vaccine recommendations. UTD on all vaccines. Mammo/pap UTD. HP labs drawn today.  Adjustment disorder with mixed anxiety and depressed mood Continue clonazepam 0.5mg  qd-bid prn, #30, RF x 5. She'll call if she changes her mind about counseling.   An After Visit Summary was printed and given to the patient.  FOLLOW UP:  Return in about 6 months (around 02/05/2015) for f/u mood/anxiety.

## 2014-08-07 NOTE — Progress Notes (Signed)
Pre visit review using our clinic review tool, if applicable. No additional management support is needed unless otherwise documented below in the visit note. 

## 2014-08-07 NOTE — Assessment & Plan Note (Signed)
Continue clonazepam 0.5mg  qd-bid prn, #30, RF x 5. She'll call if she changes her mind about counseling.

## 2014-08-07 NOTE — Assessment & Plan Note (Signed)
Reviewed age and gender appropriate health maintenance issues (prudent diet, regular exercise, health risks of tobacco and excessive alcohol, use of seatbelts, fire alarms in home, use of sunscreen).  Also reviewed age and gender appropriate health screening as well as vaccine recommendations. UTD on all vaccines. Mammo/pap UTD. HP labs drawn today.

## 2014-08-08 LAB — COMPREHENSIVE METABOLIC PANEL
ALT: 14 U/L (ref 0–35)
AST: 19 U/L (ref 0–37)
Albumin: 4.3 g/dL (ref 3.5–5.2)
Alkaline Phosphatase: 52 U/L (ref 39–117)
BUN: 12 mg/dL (ref 6–23)
CALCIUM: 9.6 mg/dL (ref 8.4–10.5)
CHLORIDE: 105 meq/L (ref 96–112)
CO2: 24 meq/L (ref 19–32)
Creatinine, Ser: 0.7 mg/dL (ref 0.4–1.2)
GFR: 106.95 mL/min (ref >60.00)
Glucose, Bld: 87 mg/dL (ref 70–99)
POTASSIUM: 4.3 meq/L (ref 3.5–5.1)
Sodium: 138 mEq/L (ref 135–145)
Total Bilirubin: 0.6 mg/dL (ref 0.2–1.2)
Total Protein: 7.5 g/dL (ref 6.0–8.3)

## 2014-08-08 LAB — LIPID PANEL
CHOLESTEROL: 207 mg/dL — AB (ref 0–200)
HDL: 89.9 mg/dL (ref >39.00)
LDL Cholesterol: 107 mg/dL — ABNORMAL HIGH (ref 0–99)
NonHDL: 117.1
TRIGLYCERIDES: 50 mg/dL (ref 0.0–149.0)
Total CHOL/HDL Ratio: 2
VLDL: 10 mg/dL (ref 0.0–40.0)

## 2014-08-20 ENCOUNTER — Encounter: Payer: Self-pay | Admitting: Obstetrics & Gynecology

## 2014-09-03 ENCOUNTER — Encounter: Payer: Self-pay | Admitting: Advanced Practice Midwife

## 2014-09-03 ENCOUNTER — Ambulatory Visit (INDEPENDENT_AMBULATORY_CARE_PROVIDER_SITE_OTHER): Payer: 59 | Admitting: Advanced Practice Midwife

## 2014-09-03 VITALS — BP 110/70 | Ht 69.0 in | Wt 172.0 lb

## 2014-09-03 DIAGNOSIS — N750 Cyst of Bartholin's gland: Secondary | ICD-10-CM

## 2014-09-03 MED ORDER — METRONIDAZOLE 500 MG PO TABS: 500.0000 mg | ORAL_TABLET | Freq: Two times a day (BID) | ORAL | Status: DC

## 2014-09-03 MED ORDER — DOXYCYCLINE HYCLATE 100 MG PO CAPS: 100.0000 mg | ORAL_CAPSULE | Freq: Two times a day (BID) | ORAL | Status: DC

## 2014-09-03 MED ORDER — ONDANSETRON HCL 4 MG PO TABS: 4.0000 mg | ORAL_TABLET | Freq: Three times a day (TID) | ORAL | Status: DC | PRN

## 2014-09-03 NOTE — Progress Notes (Signed)
[facility] Visit  Patient name: Stacey Walton MRN [id number]  Date of birth: 26-Jan-1976  CC & HPI:  Stacey Walton is a 38 y.o. Caucasian female presenting today for evaluation of progressively painful vaginal cyst.   Had Left bartholin gland removed several years ago d/t recurrant cysts.  Sunday, noticed a pea sized area when bathing (no pain)  Since then, it has increased in sized and now hurts, even when sitting  Pertinent History Reviewed:  Medical & Surgical Hx:   Past Medical History  Diagnosis Date  . Bartholin gland cyst     Recurrent (left)  . Vulvar cyst     Excised 2012  . History of anemia   . Headache disorder     Tension in neck, occiput, tense shoulders.  No migrainous features.  Takes motrin and/or tylenol and it helps usually.  MRI neck and brain in the past normal (2009)  . GERD (gastroesophageal reflux disease)   . UTI (lower urinary tract infection)   . GAD (generalized anxiety disorder)    Past Surgical History  Procedure Laterality Date  . Cesarean section    . Cystectomy  2012    Vulvar (Dr. Elonda Husky)   Medications: Reviewed & Updated - see associated section Social History: Reviewed -  reports that she has never smoked. She has never used smokeless tobacco.  Objective Findings:  Vitals: BP 110/70 mmHg  Ht 5\' 9"  (1.753 m)  Wt 172 lb (78.019 kg)  BMI 25.39 kg/m2  LMP 08/20/2014  Physical Examination: General appearance - alert, well appearing, and in no distress Mental status - alert, oriented to person, place, and time Pelvic - Co-Exam with JVF.  1.5cm cyst left introitus.  No results found for this or any previous visit (from the past 24 hour(s)).   Assessment & Plan:  A:   Recurrant bartholin gland cyst P:  Doxycycline/flagyl   F/U Monday for possible drainage, Friday if not improving.  Some consideration to removing the rest of the gland in the OR if keeps recurring   CRESENZO-DISHMAN,Imo Cumbie CNM 09/03/2014 11:08  AM

## 2014-09-05 ENCOUNTER — Encounter: Payer: Self-pay | Admitting: Obstetrics and Gynecology

## 2014-09-05 ENCOUNTER — Ambulatory Visit (INDEPENDENT_AMBULATORY_CARE_PROVIDER_SITE_OTHER): Payer: 59 | Admitting: Obstetrics and Gynecology

## 2014-09-05 VITALS — BP 122/74 | Ht 69.0 in | Wt 171.0 lb

## 2014-09-05 DIAGNOSIS — N751 Abscess of Bartholin's gland: Secondary | ICD-10-CM | POA: Insufficient documentation

## 2014-09-05 MED ORDER — HYDROCODONE-ACETAMINOPHEN 5-325 MG PO TABS: 1.0000 | ORAL_TABLET | Freq: Four times a day (QID) | ORAL | Status: DC | PRN

## 2014-09-05 NOTE — Progress Notes (Signed)
Patient ID: Stacey Walton, female   DOB: 1976-02-11, 38 y.o.   MRN: [id number]   [facility] Visit  Patient name: Stacey Walton MRN [id number]  Date of birth: 1975/11/19  CC & HPI:  [name] is a 38 y.o. 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 She denies any other symptoms.    ROS:  +Recurrent bartholin cyst on left  Pertinent History Reviewed:   Reviewed: Significant for  Medical         Past Medical History  Diagnosis Date  . Bartholin gland cyst     Recurrent (left)  . Vulvar cyst     Excised 2012  . History of anemia   . Headache disorder     Tension in neck, occiput, tense shoulders.  No migrainous features.  Takes motrin and/or tylenol and it helps usually.  MRI neck and brain in the past normal (2009)  . GERD (gastroesophageal reflux disease)   . UTI (lower urinary tract infection)   . GAD (generalized anxiety disorder)                               Surgical Hx:    Past Surgical History  Procedure Laterality Date  . Cesarean section    . Cystectomy  2012    Vulvar (Dr. Elonda Husky)   Medications: Reviewed & Updated - see associated section                      Current outpatient prescriptions: doxycycline (VIBRAMYCIN) 100 MG capsule, Take 1 capsule (100 mg total) by mouth 2 (two) times daily., Disp: 14 capsule, Rfl: 0;  meloxicam (MOBIC) 7.5 MG tablet, 1-2 tabs po qd prn, Disp: 60 tablet, Rfl: 6;  metroNIDAZOLE (FLAGYL) 500 MG tablet, Take 1 tablet (500 mg total) by mouth 2 (two) times daily., Disp: 14 tablet, Rfl: 0 norethindrone-ethinyl estradiol (JUNEL FE 1/20) 1-20 MG-MCG tablet, TAKE 1 TABLET BY MOUTH ONCE DAILY, Disp: 84 tablet, Rfl: 3;  omeprazole (PRILOSEC) 40 MG capsule, Take 1 capsule (40 mg total) by mouth daily., Disp: 90 capsule, Rfl: 3;  ondansetron (ZOFRAN) 4 MG tablet, Take 1 tablet (4 mg total) by mouth every 8 (eight) hours as needed for nausea or vomiting., Disp: 20 tablet, Rfl: 0 aluminum & magnesium  hydroxide-simethicone (MYLANTA) 500-450-40 MG/5ML suspension, Take 10 mLs by mouth 4 (four) times daily - after meals and at bedtime. (Patient not taking: Reported on 09/03/2014), Disp: 355 mL, Rfl: 0;  clonazePAM (KLONOPIN) 0.5 MG tablet, Take 1 tablet (0.5 mg total) by mouth as needed for anxiety. (Patient not taking: Reported on 09/03/2014), Disp: 30 tablet, Rfl: 5   Social History: Reviewed -  reports that she has never smoked. She has never used smokeless tobacco.  Objective Findings:  Vitals: Blood pressure 122/74, height 5\' 9"  (1.753 m), weight 171 lb (77.565 kg), last menstrual period 08/20/2014.  Physical Examination:  Pelvic -, vagina, cervix, uterus and adnexa,  VULVA: vulvar tenderness, vulvar edema,  And vulvar lesion on the left, vulvar mass inflamed, with overlying edema of hymen remnant.,    Assessment & Plan:   A:  1. Recurrent bartholin cyst with abscess.on the left 2. abscesson the left labia majora, and Hymen remnant that is edematous 3.  I & D performed local aneswth with trimming of skin edge to delay closure, and excision of necrotic tissue in center.  P:  1. I&D of recurrent bartholin cyst on left 2. Ice pack for the first 24 hours and then f/u with warm washcloth. 2. F/U next week if the recurrent bartholin cyst is not better. 3. Continue abx  This chart was scribed for Jonnie Kind, MD by Steva Colder, ED Scribe. The patient was seen in room 2 at 10:01 AM.

## 2014-09-05 NOTE — Progress Notes (Deleted)
[facility] Visit  Patient name: Stacey Walton MRN [id number]  Date of birth: 1975-11-24  CC & HPI:  Stacey Walton is a 38 y.o. 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 She denies any other symptoms.    ROS:  +Recurrent bartholin cyst on left  Pertinent History Reviewed:   Reviewed: Significant for  Medical         Past Medical History  Diagnosis Date   Bartholin gland cyst     Recurrent (left)   Vulvar cyst     Excised 2012   History of anemia    Headache disorder     Tension in neck, occiput, tense shoulders.  No migrainous features.  Takes motrin and/or tylenol and it helps usually.  MRI neck and brain in the past normal (2009)   GERD (gastroesophageal reflux disease)    UTI (lower urinary tract infection)    GAD (generalized anxiety disorder)                               Surgical Hx:    Past Surgical History  Procedure Laterality Date   Cesarean section     Cystectomy  2012    Vulvar (Dr. Elonda Husky)   Medications: Reviewed & Updated - see associated section                      Current outpatient prescriptions: doxycycline (VIBRAMYCIN) 100 MG capsule, Take 1 capsule (100 mg total) by mouth 2 (two) times daily., Disp: 14 capsule, Rfl: 0;  meloxicam (MOBIC) 7.5 MG tablet, 1-2 tabs po qd prn, Disp: 60 tablet, Rfl: 6;  metroNIDAZOLE (FLAGYL) 500 MG tablet, Take 1 tablet (500 mg total) by mouth 2 (two) times daily., Disp: 14 tablet, Rfl: 0 norethindrone-ethinyl estradiol (JUNEL FE 1/20) 1-20 MG-MCG tablet, TAKE 1 TABLET BY MOUTH ONCE DAILY, Disp: 84 tablet, Rfl: 3;  omeprazole (PRILOSEC) 40 MG capsule, Take 1 capsule (40 mg total) by mouth daily., Disp: 90 capsule, Rfl: 3;  ondansetron (ZOFRAN) 4 MG tablet, Take 1 tablet (4 mg total) by mouth every 8 (eight) hours as needed for nausea or vomiting., Disp: 20 tablet, Rfl: 0 aluminum & magnesium hydroxide-simethicone (MYLANTA) 500-450-40 MG/5ML suspension, Take 10 mLs by mouth 4  (four) times daily - after meals and at bedtime. (Patient not taking: Reported on 09/03/2014), Disp: 355 mL, Rfl: 0;  clonazePAM (KLONOPIN) 0.5 MG tablet, Take 1 tablet (0.5 mg total) by mouth as needed for anxiety. (Patient not taking: Reported on 09/03/2014), Disp: 30 tablet, Rfl: 5   Social History: Reviewed -  reports that she has never smoked. She has never used smokeless tobacco.  Objective Findings:  Vitals: Blood pressure 122/74, height 5\' 9"  (1.753 m), weight 171 lb (77.565 kg), last menstrual period 08/20/2014.  Physical Examination:  Pelvic -, vagina, cervix, uterus and adnexa,  VULVA: vulvar tenderness, vulvar edema,  And vulvar lesion on the left, vulvar mass inflamed, with overlying edema of hymen remnant.,    Assessment & Plan:   A:  1. Recurrent bartholin cyst with abscess.on the left 2. abscesson the left labia majora, and Hymen remnant that is edematous 3.  I & D performed local aneswth  P:  1. I&D of recurrent bartholin cyst on left 2. Ice pack for the first 24 hours and then f/u with warm washcloth. 2. F/U next week if the recurrent bartholin  cyst is not better. 3. Continue abx  This chart was scribed for Jonnie Kind, MD by Steva Colder, ED Scribe. The patient was seen in room 2 at 10:01 AM.

## 2014-09-05 NOTE — Patient Instructions (Signed)
Bartholin's Cyst or Abscess °Bartholin's glands are small glands located within the folds of skin (labia) along the sides of the lower opening of the vagina (birth canal). A cyst may develop when the duct of the gland becomes blocked. When this happens, fluid that accumulates within the cyst can become infected. This is known as an abscess. The Bartholin gland produces a mucous fluid to lubricate the outside of the vagina during sexual intercourse. °SYMPTOMS  °· Patients with a small cyst may not have any symptoms. °· Mild discomfort to severe pain depending on the size of the cyst and if it is infected (abscess). °· Pain, redness, and swelling around the lower opening of the vagina. °· Painful intercourse. °· Pressure in the perineal area. °· Swelling of the lips of the vagina (labia). °· The cyst or abscess can be on one side or both sides of the vagina. °DIAGNOSIS  °· A large swelling is seen in the lower vagina area by your caregiver. °· Painful to touch. °· Redness and pain, if it is an abscess. °TREATMENT  °· Sometimes the cyst will go away on its own. °· Apply warm wet compresses to the area or take hot sitz baths several times a day. °· An incision to drain the cyst or abscess with local anesthesia. °· Culture the pus, if it is an abscess. °· Antibiotic treatment, if it is an abscess. °· Cut open the gland and suture the edges to make the opening of the gland bigger (marsupialization). °· Remove the whole gland if the cyst or abscess returns. °PREVENTION  °· Practice good hygiene. °· Clean the vaginal area with a mild soap and soft cloth when bathing. °· Do not rub hard in the vaginal area when bathing. °· Protect the crotch area with a padded cushion if you take long bike rides or ride horses. °· Be sure you are well lubricated when you have sexual intercourse. °HOME CARE INSTRUCTIONS  °· If your cyst or abscess was opened, a small piece of gauze, or a drain, may have been placed in the wound to allow  drainage. Do not remove this gauze or drain unless directed by your caregiver. °· Wear feminine pads, not tampons, as needed for any drainage or bleeding. °· If antibiotics were prescribed, take them exactly as directed. Finish the entire course. °· Only take over-the-counter or prescription medicines for pain, discomfort, or fever as directed by your caregiver. °SEEK IMMEDIATE MEDICAL CARE IF:  °· You have an increase in pain, redness, swelling, or drainage. °· You have bleeding from the wound which results in the use of more than the number of pads suggested by your caregiver in 24 hours. °· You have chills. °· You have a fever. °· You develop any new problems (symptoms) or aggravation of your existing condition. °MAKE SURE YOU:  °· Understand these instructions. °· Will watch your condition. °· Will get help right away if you are not doing well or get worse. °Document Released: 09/05/2005 Document Revised: 11/28/2011 Document Reviewed: 04/23/2008 °ExitCare® Patient Information ©2015 [employer]. This information is not intended to replace advice given to you by your health care provider. Make sure you discuss any questions you have with your health care provider. ° °

## 2014-09-08 ENCOUNTER — Ambulatory Visit: Payer: 59 | Admitting: Obstetrics and Gynecology

## 2014-09-10 ENCOUNTER — Ambulatory Visit: Payer: 59 | Admitting: Obstetrics and Gynecology

## 2014-09-19 DIAGNOSIS — R87613 High grade squamous intraepithelial lesion on cytologic smear of cervix (HGSIL): Secondary | ICD-10-CM

## 2014-09-19 HISTORY — DX: High grade squamous intraepithelial lesion on cytologic smear of cervix (HGSIL): R87.613

## 2014-10-09 ENCOUNTER — Encounter: Payer: Self-pay | Admitting: Family Medicine

## 2014-10-09 ENCOUNTER — Ambulatory Visit (INDEPENDENT_AMBULATORY_CARE_PROVIDER_SITE_OTHER): Payer: 59 | Admitting: Family Medicine

## 2014-10-09 VITALS — BP 119/83 | HR 71 | Temp 98.5°F | Ht 69.0 in | Wt 176.0 lb

## 2014-10-09 DIAGNOSIS — R51 Headache: Secondary | ICD-10-CM

## 2014-10-09 DIAGNOSIS — R519 Headache, unspecified: Secondary | ICD-10-CM

## 2014-10-09 MED ORDER — RIZATRIPTAN BENZOATE 10 MG PO TABS: ORAL_TABLET | ORAL | Status: DC

## 2014-10-09 NOTE — Progress Notes (Signed)
Pre visit review using our clinic review tool, if applicable. No additional management support is needed unless otherwise documented below in the visit note. 

## 2014-10-09 NOTE — Progress Notes (Signed)
OFFICE NOTE  10/09/2014  CC:  Chief Complaint  Stacey Walton presents with  . Headache     HPI: Stacey Walton is a 39 y.o. Caucasian female who is here for headaches. About 3 week hx of multiple HA's per day: all right side of head, seems to begin in right upper neck/right occiput area a lot but eventually involves entire right side of head including.  No eye drooping or tearing.  No vision or hearing abnormality.  Intensity 5/10 typically, but occ 8-9/10.  Typical duration: hours, sometimes waxes and wanes all day.  Usually responds somewhat to either 800 mg motrin or excedrin: she has been taking one of these multiple times per day over last few weeks.  A few of the headaches have woken her up from sleep but this has been unusual. No nausea/vomiting, no photo or phonophobia.  No dizziness.   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   Pertinent PMH:  Past medical, surgical, social, and family history reviewed and no changes are noted since last office visit.  MEDS:  Outpatient Prescriptions Prior to Visit  Medication Sig Dispense Refill  . aluminum & magnesium hydroxide-simethicone (MYLANTA) 500-450-40 MG/5ML suspension Take 10 mLs by mouth 4 (four) times daily - after meals and at bedtime. 355 mL 0  . clonazePAM (KLONOPIN) 0.5 MG tablet Take 1 tablet (0.5 mg total) by mouth as needed for anxiety. 30 tablet 5  . HYDROcodone-acetaminophen (NORCO/VICODIN) 5-325 MG per tablet Take 1 tablet by mouth every 6 (six) hours as needed. 30 tablet 0  . meloxicam (MOBIC) 7.5 MG tablet 1-2 tabs po qd prn 60 tablet 6  . norethindrone-ethinyl estradiol (JUNEL FE 1/20) 1-20 MG-MCG tablet TAKE 1 TABLET BY MOUTH ONCE DAILY 84 tablet 3  . omeprazole (PRILOSEC) 40 MG capsule Take 1 capsule (40 mg total) by mouth daily. 90 capsule 3  . doxycycline (VIBRAMYCIN) 100 MG capsule Take 1 capsule (100 mg total) by mouth 2  (two) times daily. (Stacey Walton not taking: Reported on 10/09/2014) 14 capsule 0  . metroNIDAZOLE (FLAGYL) 500 MG tablet Take 1 tablet (500 mg total) by mouth 2 (two) times daily. (Stacey Walton not taking: Reported on 10/09/2014) 14 tablet 0  . ondansetron (ZOFRAN) 4 MG tablet Take 1 tablet (4 mg total) by mouth every 8 (eight) hours as needed for nausea or vomiting. (Stacey Walton not taking: Reported on 10/09/2014) 20 tablet 0   No facility-administered medications prior to visit.    PE: Blood pressure 119/83, pulse 71, temperature 98.5 F (36.9 C), temperature source Temporal, height 5\' 9"  (1.753 m), weight 176 lb (79.833 kg), SpO2 98 %. Gen: Alert, well appearing.  Stacey Walton is oriented to person, place, time, and situation. LGX:QJJH: no injection, icteris, swelling, or exudate.  EOMI, PERRLA.  Fundoscopy: clear disc margins bilat. Mouth: lips without lesion/swelling.  Oral mucosa pink and moist. Oropharynx without erythema, exudate, or swelling.  Neuro: CN 2-12 intact bilaterally, strength 5/5 in proximal and distal upper extremities and lower extremities bilaterally.   No tremor.  No disdiadochokinesis.  No ataxia.  Upper extremity and lower extremity DTRs symmetric.  No pronator drift.   IMPRESSION AND PLAN:  Atypical headaches, occurring more lately in a 3 week cluster of time. I suspect cervicalgia/occipital region is the region of onset of the HA's, which then "partially" generalize" into a severe tension HA or atypical migraine HA. Deferred imaging  at this time. Decided to ask neurology to see her for further expert help. In the meantime I would like her to try maxalt as a HA abortive med and see how it compares in efficacy to her excedrin/motrin. Pt agrees with plan.  An After Visit Summary was printed and given to the Stacey Walton.   FOLLOW UP: prn

## 2014-11-11 ENCOUNTER — Telehealth: Payer: Self-pay | Admitting: Neurology

## 2014-11-11 NOTE — Telephone Encounter (Signed)
Pt r/s her NP appt from 11/20/14 to 12/29/14 due to her having finals and having to work. dr Billee Cashing gowen/referring provider was notified.

## 2014-11-13 ENCOUNTER — Other Ambulatory Visit: Payer: Self-pay | Admitting: Internal Medicine

## 2014-11-20 ENCOUNTER — Ambulatory Visit: Payer: 59 | Admitting: Neurology

## 2014-12-22 ENCOUNTER — Telehealth: Payer: Self-pay | Admitting: Neurology

## 2014-12-22 NOTE — Telephone Encounter (Signed)
Pt called to cancel NP appt w/ Dr. Tomi Likens for 12/29/14. Pt stated that she does not have time at the moment to be seen. dr Billee Cashing gowen/referring provider was notified.

## 2014-12-29 ENCOUNTER — Ambulatory Visit: Payer: 59 | Admitting: Neurology

## 2014-12-31 ENCOUNTER — Other Ambulatory Visit: Payer: Self-pay | Admitting: *Deleted

## 2014-12-31 MED ORDER — MELOXICAM 7.5 MG PO TABS: ORAL_TABLET | ORAL | Status: DC

## 2014-12-31 NOTE — Telephone Encounter (Signed)
Refill request for Meloxicam Last filled by MD on- 08/07/14 #60 x6 Last Appt: 10/09/2014 Next Appt: 02/09/2015 Please advise refill?

## 2015-02-09 ENCOUNTER — Ambulatory Visit: Payer: 59 | Admitting: Family Medicine

## 2015-03-30 ENCOUNTER — Encounter: Payer: Self-pay | Admitting: Family Medicine

## 2015-03-30 ENCOUNTER — Ambulatory Visit (INDEPENDENT_AMBULATORY_CARE_PROVIDER_SITE_OTHER): Payer: 59 | Admitting: Family Medicine

## 2015-03-30 VITALS — BP 117/82 | HR 80 | Temp 98.0°F | Resp 16 | Ht 69.0 in | Wt 174.8 lb

## 2015-03-30 DIAGNOSIS — S92301S Fracture of unspecified metatarsal bone(s), right foot, sequela: Secondary | ICD-10-CM | POA: Diagnosis not present

## 2015-03-30 NOTE — Progress Notes (Signed)
OFFICE NOTE  03/30/2015  CC:  Chief Complaint  Patient presents with  . Follow-up    Right foot fracture   HPI: Patient is a 39 y.o. Caucasian female who is here for 1 week of pain and swelling on top of R foot. Started after moving furnature 03/23/15.  Hurt, swelling persisted so she eventually saw [facility] clinic provider 2 d/a and x-ray showed nondisplaced fracture of 2nd metatarsal bone.  Not wearing post-op shoe although she was offered one at [facility]. Takes a lortab hs.  Has been taking it easy since the x-ray.  She was given a note for 1 week off.  She'll need FMLA forms filled out.  Days of work missed so far are 7/6, 7/9, and today. She has been icing it some.  Pertinent PMH:  Past medical, surgical, social, and family history reviewed and no changes are noted since last office visit.  History   Social History Narrative   Divorced, remarried, has one teenage son.   She has an associates degree in nursing and works at [employer].   No T/A/Ds.   No significant exercise.   Normal american diet.             MEDS:  Outpatient Prescriptions Prior to Visit  Medication Sig Dispense Refill  . aluminum & magnesium hydroxide-simethicone (MYLANTA) 500-450-40 MG/5ML suspension Take 10 mLs by mouth 4 (four) times daily - after meals and at bedtime. 355 mL 0  . clonazePAM (KLONOPIN) 0.5 MG tablet Take 1 tablet (0.5 mg total) by mouth as needed for anxiety. 30 tablet 5  . HYDROcodone-acetaminophen (NORCO/VICODIN) 5-325 MG per tablet Take 1 tablet by mouth every 6 (six) hours as needed. 30 tablet 0  . meloxicam (MOBIC) 7.5 MG tablet 1-2 tabs po qd prn 60 tablet 6  . norethindrone-ethinyl estradiol (JUNEL FE 1/20) 1-20 MG-MCG tablet TAKE 1 TABLET BY MOUTH ONCE DAILY 84 tablet 3  . omeprazole (PRILOSEC) 40 MG capsule TAKE 1 CAPSULE (40 MG TOTAL) BY MOUTH DAILY. 90 capsule 2  . rizatriptan (MAXALT) 10 MG tablet 1 tab at onset of HA, may repeat in 2 hours if HA not 50% improved, max in 24h is 30mg  9 tablet  1   No facility-administered medications prior to visit.    PE: Blood pressure 117/82, pulse 80, temperature 98 F (36.7 C), temperature source Oral, resp. rate 16, height 5\' 9"  (1.753 m), weight 174 lb 12.8 oz (79.289 kg), SpO2 98 %. Gen: Alert, well appearing.  Patient is oriented to person, place, time, and situation. Right foot dorsal surface with mild swelling over distal aspect of metatarsals 1-3.  No erythema.  She walks with a limp, with wt focuses on lateral aspect of R foot.  TTP over distal 1/3 of 2nd metatarsal.  No other tenderness.  Ankle ROM fully intact w/out pain.  Ankle without swelling.  IMPRESSION AND PLAN:  Right 2nd metatarsal distal fracture, closed and nondisplaced. Reviewed x-rays pt brought with her today, reviewed radiologist's x-ray reading. The fracture is only visible on lateral view. Recommended walking boot but it turns out pt cannot afford to buy this so a hard soled shoe was rx'd. I did not give any further pain meds today as pt said she has about 17 pills left and she usually just takes 1 per night. Needs to rest, stay out of work until I see her again for f/u 04/09/15.  I have ordered a f/u foot x-ray to be done at [facility] just prior to her  f/u appt with me so we can see if approp healing is occurring (original x-ray date is 03/28/15).  Spent 30 min with pt today, with >50% of this time spent in counseling and care coordination regarding the above problems +filling out FMLA paperwork.  An After Visit Summary was printed and given to the patient.  FOLLOW UP: 04/09/15

## 2015-03-31 DIAGNOSIS — Z0279 Encounter for issue of other medical certificate: Secondary | ICD-10-CM

## 2015-04-09 ENCOUNTER — Ambulatory Visit (INDEPENDENT_AMBULATORY_CARE_PROVIDER_SITE_OTHER): Payer: 59 | Admitting: Family Medicine

## 2015-04-09 ENCOUNTER — Ambulatory Visit: Payer: 59 | Admitting: Family Medicine

## 2015-04-09 ENCOUNTER — Telehealth: Payer: Self-pay | Admitting: *Deleted

## 2015-04-09 ENCOUNTER — Ambulatory Visit (HOSPITAL_COMMUNITY)
Admission: RE | Admit: 2015-04-09 | Discharge: 2015-04-09 | Disposition: A | Discharge status: Home / Self Care | Payer: 59 | Source: Ambulatory Visit | Attending: Family Medicine | Admitting: Family Medicine

## 2015-04-09 ENCOUNTER — Encounter: Payer: Self-pay | Admitting: Family Medicine

## 2015-04-09 VITALS — BP 119/80 | HR 53 | Temp 98.1°F | Resp 16 | Ht 69.0 in | Wt 174.0 lb

## 2015-04-09 DIAGNOSIS — S92301D Fracture of unspecified metatarsal bone(s), right foot, subsequent encounter for fracture with routine healing: Secondary | ICD-10-CM | POA: Diagnosis not present

## 2015-04-09 DIAGNOSIS — S92321D Displaced fracture of second metatarsal bone, right foot, subsequent encounter for fracture with routine healing: Secondary | ICD-10-CM | POA: Insufficient documentation

## 2015-04-09 DIAGNOSIS — S92301S Fracture of unspecified metatarsal bone(s), right foot, sequela: Secondary | ICD-10-CM

## 2015-04-09 DIAGNOSIS — X58XXXD Exposure to other specified factors, subsequent encounter: Secondary | ICD-10-CM | POA: Diagnosis not present

## 2015-04-09 NOTE — Progress Notes (Signed)
OFFICE NOTE  04/09/2015  CC:  Chief Complaint  Patient presents with  . Follow-up    10 day f/u   HPI: Patient is a 39 y.o. Caucasian female who is here for 10d f/u fracture of right foot 2nd metatarsal bone (distal).  Taking mobic daily.  Feels improved daily but has apprehension about doing 3 twelve hour shifts in 3 consecutive days coming up. Taking vicodin rarely.  Ice/elevation helpful, as is post-op shoe.  Pertinent PMH:  Past medical, surgical, social, and family history reviewed and no changes are noted since last office visit.  MEDS:  Outpatient Prescriptions Prior to Visit  Medication Sig Dispense Refill  . aluminum & magnesium hydroxide-simethicone (MYLANTA) 500-450-40 MG/5ML suspension Take 10 mLs by mouth 4 (four) times daily - after meals and at bedtime. 355 mL 0  . clonazePAM (KLONOPIN) 0.5 MG tablet Take 1 tablet (0.5 mg total) by mouth as needed for anxiety. 30 tablet 5  . meloxicam (MOBIC) 7.5 MG tablet 1-2 tabs po qd prn 60 tablet 6  . norethindrone-ethinyl estradiol (JUNEL FE 1/20) 1-20 MG-MCG tablet TAKE 1 TABLET BY MOUTH ONCE DAILY 84 tablet 3  . omeprazole (PRILOSEC) 40 MG capsule TAKE 1 CAPSULE (40 MG TOTAL) BY MOUTH DAILY. 90 capsule 2  . rizatriptan (MAXALT) 10 MG tablet 1 tab at onset of HA, may repeat in 2 hours if HA not 50% improved, max in 24h is 30mg  9 tablet 1  . HYDROcodone-acetaminophen (NORCO/VICODIN) 5-325 MG per tablet Take 1 tablet by mouth every 6 (six) hours as needed. (Patient not taking: Reported on 04/09/2015) 30 tablet 0   No facility-administered medications prior to visit.    PE: Blood pressure 119/80, pulse 53, temperature 98.1 F (36.7 C), temperature source Oral, resp. rate 16, height 5\' 9"  (1.753 m), weight 174 lb (78.926 kg), last menstrual period 03/23/2015, SpO2 98 %. Gen: Alert, well appearing.  Patient is oriented to person, place, time, and situation. Right foot: mild swelling over distal 2nd metatarsal, without ecchymoses or  erythema. Tender to palpation over distal 1/3 of 2nd metatarsal region.  She can wiggle all toes freely and without pain.    IMPRESSION AND PLAN: Right 2nd toe distal 1/3 metatarsal fracture.   Reviewed her f/u x-ray that was done today--I see some sign of healing along the fracture line on the AP view.  Her angulation is the same. I compared this x-ray to the initial x-ray she had via the [facility] system.  She is improving daily. Continue RICE prn, continue post-op shoe when she can (work will not allow open-toed shoe). I did an addendum to her FMLA papers stating she needs max of 8 hour shifts, max of 3d/week from 7/23-7/26, 2016. Signs/symptoms to call or return for were reviewed and pt expressed understanding.  An After Visit Summary was printed and given to the patient.  FOLLOW UP: prn

## 2015-04-09 NOTE — Progress Notes (Signed)
Pre visit review using our clinic review tool, if applicable. No additional management support is needed unless otherwise documented below in the visit note. 

## 2015-04-09 NOTE — Telephone Encounter (Signed)
Pt LMOM stating that she needs a letter/note from Dr. Anitra Lauth stating that she can go back to work. Please advise. Thanks.

## 2015-04-10 ENCOUNTER — Encounter: Payer: Self-pay | Admitting: Family Medicine

## 2015-04-10 ENCOUNTER — Telehealth: Payer: Self-pay | Admitting: Family Medicine

## 2015-04-10 NOTE — Telephone Encounter (Signed)
Please call pt in regards to [facility] paperwork Dr. Anitra Lauth completed yesterday at her visit. She is having trouble with her employer.

## 2015-04-10 NOTE — Telephone Encounter (Signed)
Letter printed.

## 2015-04-10 NOTE — Telephone Encounter (Signed)
Pt advised and voiced understanding. She stated that she thought that was said during her visit but wasn't sure. She stated that she would like for [place] to fax the letter to her work fax. She will call back with that number.

## 2015-04-10 NOTE — Telephone Encounter (Signed)
Pt called back and stated that she needs a correction made to her FMLA form. She stated that it was mentioned on the form that she can only be on her feet for no more than 15 mins at a time. She stated that she will not be able to go to work if that is her restrictions. I advised her I would find her FMLA and review it with Dr. Anitra Lauth to figure out if a correction needs to be make. Per Dr. Anitra Lauth the note listed on question 4 of the form is for the dates listed in question 5 on the form, as directed in question 6 pt may return to work for 8 hours per day x 3 days a week starting 04/11/15 through 04/14/15. He has also printed letter pt was requesting earlier. Left message for pt to call back.

## 2015-04-10 NOTE — Telephone Encounter (Signed)
Letter faxed to pts work [phone] and [phone] per her request.

## 2015-04-10 NOTE — Telephone Encounter (Signed)
Left message for pt to call back  °

## 2015-06-15 ENCOUNTER — Other Ambulatory Visit: Payer: Self-pay | Admitting: Obstetrics & Gynecology

## 2015-06-17 ENCOUNTER — Ambulatory Visit (INDEPENDENT_AMBULATORY_CARE_PROVIDER_SITE_OTHER): Payer: 59 | Admitting: Advanced Practice Midwife

## 2015-06-17 ENCOUNTER — Encounter: Payer: Self-pay | Admitting: Obstetrics & Gynecology

## 2015-06-17 ENCOUNTER — Other Ambulatory Visit (HOSPITAL_COMMUNITY)
Admission: RE | Admit: 2015-06-17 | Discharge: 2015-06-17 | Disposition: A | Discharge status: Home / Self Care | Payer: 59 | Source: Ambulatory Visit | Attending: Advanced Practice Midwife | Admitting: Advanced Practice Midwife

## 2015-06-17 VITALS — BP 110/70 | HR 64 | Temp 98.6°F | Ht 69.0 in | Wt 180.0 lb

## 2015-06-17 DIAGNOSIS — Z01411 Encounter for gynecological examination (general) (routine) with abnormal findings: Secondary | ICD-10-CM | POA: Diagnosis not present

## 2015-06-17 DIAGNOSIS — Z1151 Encounter for screening for human papillomavirus (HPV): Secondary | ICD-10-CM | POA: Diagnosis present

## 2015-06-17 DIAGNOSIS — Z01419 Encounter for gynecological examination (general) (routine) without abnormal findings: Secondary | ICD-10-CM | POA: Diagnosis not present

## 2015-06-17 DIAGNOSIS — R319 Hematuria, unspecified: Secondary | ICD-10-CM

## 2015-06-17 MED ORDER — CIPROFLOXACIN HCL 500 MG PO TABS: 500.0000 mg | ORAL_TABLET | Freq: Two times a day (BID) | ORAL | Status: DC

## 2015-06-17 NOTE — Progress Notes (Signed)
Stacey Walton 39 y.o.  Filed Vitals:   06/17/15 0927  BP: 110/70  Pulse: 64  Temp: 98.6 F (37 C)     Past Medical History: Past Medical History  Diagnosis Date  . Bartholin gland cyst     Recurrent (left)  . Vulvar cyst     Excised 2012  . History of anemia   . Headache disorder     Tension in neck, occiput, tense shoulders.  No migrainous features.  Takes motrin and/or tylenol and it helps usually.  MRI neck and brain in the past normal (2009)  . GERD (gastroesophageal reflux disease)   . UTI (lower urinary tract infection)   . GAD (generalized anxiety disorder)   . Broken foot     Right foot    Past Surgical History: Past Surgical History  Procedure Laterality Date  . Cesarean section    . Cystectomy  2012    Vulvar (Dr. Elonda Husky)    Family History: Family History  Problem Relation Age of Onset  . Hypertension Father   . Transient ischemic attack Father     Social History: Social History  Substance Use Topics  . Smoking status: Never Smoker   . Smokeless tobacco: Never Used  . Alcohol Use: Yes     Comment: rarely    Allergies: No Known Allergies    Current outpatient prescriptions:  .  aluminum & magnesium hydroxide-simethicone (MYLANTA) 500-450-40 MG/5ML suspension, Take 10 mLs by mouth 4 (four) times daily - after meals and at bedtime., Disp: 355 mL, Rfl: 0 .  norethindrone-ethinyl estradiol (JUNEL FE 1/20) 1-20 MG-MCG tablet, TAKE 1 TABLET BY MOUTH ONCE DAILY, Disp: 84 tablet, Rfl: 3 .  omeprazole (PRILOSEC) 40 MG capsule, TAKE 1 CAPSULE (40 MG TOTAL) BY MOUTH DAILY., Disp: 90 capsule, Rfl: 2 .  ciprofloxacin (CIPRO) 500 MG tablet, Take 1 tablet (500 mg total) by mouth 2 (two) times daily., Disp: 6 tablet, Rfl: 0 .  clonazePAM (KLONOPIN) 0.5 MG tablet, Take 1 tablet (0.5 mg total) by mouth as needed for anxiety. (Patient not taking: Reported on 06/17/2015), Disp: 30 tablet, Rfl: 5 .  meloxicam (MOBIC) 7.5 MG tablet, 1-2 tabs po qd prn (Patient not  taking: Reported on 06/17/2015), Disp: 60 tablet, Rfl: 6 .  rizatriptan (MAXALT) 10 MG tablet, 1 tab at onset of HA, may repeat in 2 hours if HA not 50% improved, max in 24h is 30mg  (Patient not taking: Reported on 06/17/2015), Disp: 9 tablet, Rfl: 1   Urine large hgb  Review of Systems   Patient denies any headaches, blurred vision, shortness of breath, chest pain, abdominal pain, problems with bowel movements, or intercourse.   Physical Exam: General:  Well developed, well nourished, no acute distress Skin:  Warm and dry Neck:  Midline trachea, normal thyroid Lungs; Clear to auscultation bilaterally Breast:  No dominant palpable mass, retraction, or nipple discharge Cardiovascular: Regular rate and rhythm Abdomen:  Soft, non tender, no hepatosplenomegaly Pelvic:  External genitalia is normal in appearance.  The vagina is normal in appearance.  The cervix is bulbous.  Uterus is felt to be normal size, shape, and contour.  No adnexal masses or tenderness noted.  Extremities:  No swelling or varicosities noted Psych:  No mood changes.     Impression: probable UTI Normal GYN exam     Plan: Cipro 500mg  BID X3; culture urine If HPV +, proceed to colpo

## 2015-06-18 LAB — CYTOLOGY - PAP

## 2015-06-19 LAB — URINE CULTURE

## 2015-06-25 ENCOUNTER — Encounter: Payer: Self-pay | Admitting: Obstetrics & Gynecology

## 2015-07-03 ENCOUNTER — Other Ambulatory Visit: Payer: Self-pay | Admitting: Obstetrics & Gynecology

## 2015-07-03 DIAGNOSIS — Z1231 Encounter for screening mammogram for malignant neoplasm of breast: Secondary | ICD-10-CM

## 2015-07-31 ENCOUNTER — Ambulatory Visit (HOSPITAL_COMMUNITY)
Admission: RE | Admit: 2015-07-31 | Discharge: 2015-07-31 | Disposition: A | Discharge status: Home / Self Care | Payer: 59 | Source: Ambulatory Visit | Attending: Obstetrics & Gynecology | Admitting: Obstetrics & Gynecology

## 2015-07-31 DIAGNOSIS — Z1231 Encounter for screening mammogram for malignant neoplasm of breast: Secondary | ICD-10-CM | POA: Diagnosis not present

## 2015-08-10 ENCOUNTER — Ambulatory Visit (INDEPENDENT_AMBULATORY_CARE_PROVIDER_SITE_OTHER): Payer: 59 | Admitting: Obstetrics & Gynecology

## 2015-08-10 ENCOUNTER — Other Ambulatory Visit: Payer: Self-pay | Admitting: Obstetrics & Gynecology

## 2015-08-10 VITALS — BP 120/70 | HR 64 | Wt 183.0 lb

## 2015-08-10 DIAGNOSIS — IMO0002 Reserved for concepts with insufficient information to code with codable children: Secondary | ICD-10-CM

## 2015-08-10 DIAGNOSIS — N870 Mild cervical dysplasia: Secondary | ICD-10-CM | POA: Diagnosis not present

## 2015-08-10 NOTE — Progress Notes (Signed)
Patient ID: Stacey Walton, female   DOB: 1976-03-28, 39 y.o.   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 The prior pap showed as above.  Prior cervical/vaginal disease: . Prior cervical treatment: observation.  Smoker:  No. New sexual partner:  No.  : time frame:    History of abnormal Pap: yes  Procedure Details  The risks and benefits of the procedure and Written informed consent obtained.  Speculum placed in vagina and excellent visualization of cervix achieved, cervix swabbed x 3 with acetic acid solution.  Findings: Cervix: visible lesion(s) at anterior lip, bx taken at 12 oclock o'clock, acetowhite lesion(s) noted punctation noted at  and mosaicism noted at  cervical biopsies taken at 12 o'clock and specimen labelled and sent to pathology. Vaginal inspection: normal without visible lesions. Vulvar colposcopy: vulvar colposcopy not performed.  Specimens: cx biopsy  Complications: none.  Plan: Specimens labelled and sent to Pathology. Will call with results  Image of mosaicism shown to pt and findings explained All questions answered

## 2015-08-10 NOTE — Addendum Note (Signed)
Addended by: Diona Fanti A on: 08/10/2015 10:25 AM   Modules accepted: Orders

## 2015-08-17 ENCOUNTER — Encounter (HOSPITAL_COMMUNITY)
Admission: RE | Admit: 2015-08-17 | Discharge: 2015-08-17 | Disposition: A | Discharge status: Home / Self Care | Payer: 59 | Source: Ambulatory Visit | Attending: Obstetrics & Gynecology | Admitting: Obstetrics & Gynecology

## 2015-08-17 ENCOUNTER — Other Ambulatory Visit: Payer: Self-pay | Admitting: Obstetrics & Gynecology

## 2015-08-17 ENCOUNTER — Encounter (HOSPITAL_COMMUNITY): Payer: Self-pay

## 2015-08-17 DIAGNOSIS — R87613 High grade squamous intraepithelial lesion on cytologic smear of cervix (HGSIL): Secondary | ICD-10-CM | POA: Diagnosis present

## 2015-08-17 DIAGNOSIS — Z01812 Encounter for preprocedural laboratory examination: Secondary | ICD-10-CM | POA: Diagnosis not present

## 2015-08-17 DIAGNOSIS — F411 Generalized anxiety disorder: Secondary | ICD-10-CM | POA: Diagnosis not present

## 2015-08-17 DIAGNOSIS — K219 Gastro-esophageal reflux disease without esophagitis: Secondary | ICD-10-CM | POA: Diagnosis not present

## 2015-08-17 DIAGNOSIS — R87612 Low grade squamous intraepithelial lesion on cytologic smear of cervix (LGSIL): Secondary | ICD-10-CM | POA: Diagnosis not present

## 2015-08-17 DIAGNOSIS — Z79899 Other long term (current) drug therapy: Secondary | ICD-10-CM | POA: Diagnosis not present

## 2015-08-17 LAB — COMPREHENSIVE METABOLIC PANEL
ALBUMIN: 4 g/dL (ref 3.5–5.0)
ALT: 13 U/L — ABNORMAL LOW (ref 14–54)
ANION GAP: 9 (ref 5–15)
AST: 23 U/L (ref 15–41)
Alkaline Phosphatase: 43 U/L (ref 38–126)
BUN: 10 mg/dL (ref 6–20)
CHLORIDE: 107 mmol/L (ref 101–111)
CO2: 22 mmol/L (ref 22–32)
Calcium: 9.1 mg/dL (ref 8.9–10.3)
Creatinine, Ser: 0.65 mg/dL (ref 0.44–1.00)
GFR calc Af Amer: >60 mL/min (ref >60)
GFR calc non Af Amer: >60 mL/min (ref >60)
GLUCOSE: 129 mg/dL — AB (ref 65–99)
POTASSIUM: 3.9 mmol/L (ref 3.5–5.1)
SODIUM: 138 mmol/L (ref 135–145)
TOTAL PROTEIN: 7.4 g/dL (ref 6.5–8.1)
Total Bilirubin: 0.6 mg/dL (ref 0.3–1.2)

## 2015-08-17 LAB — CBC
HEMATOCRIT: 39.2 % (ref 36.0–46.0)
Hemoglobin: 12.8 g/dL (ref 12.0–15.0)
MCH: 29.4 pg (ref 26.0–34.0)
MCHC: 32.7 g/dL (ref 30.0–36.0)
MCV: 89.9 fL (ref 78.0–100.0)
PLATELETS: 289 10*3/uL (ref 150–400)
RBC: 4.36 MIL/uL (ref 3.87–5.11)
RDW: 13 % (ref 11.5–15.5)
WBC: 7.3 10*3/uL (ref 4.0–10.5)

## 2015-08-17 LAB — HCG, QUANTITATIVE, PREGNANCY: hCG, Beta Chain, Quant, S: <1 m[IU]/mL (ref <5)

## 2015-08-17 NOTE — Pre-Procedure Instructions (Signed)
Patient given information to sign up for my chart at home. 

## 2015-08-17 NOTE — Patient Instructions (Signed)
TIFANIE NEUROHR  08/17/2015     @PREFPERIOPPHARMACY @   Your procedure is scheduled on  08/19/2015   Report to [facility] at  615  A.M.  Call this number if you have problems the morning of surgery:  [phone]   Remember:  Do not eat food or drink liquids after midnight.  Take these medicines the morning of surgery with A SIP OF WATER  Klonopin, prilosec.   Do not wear jewelry, make-up or nail polish.  Do not wear lotions, powders, or perfumes.  You may wear deodorant.  Do not shave 48 hours prior to surgery.  Men may shave face and neck.  Do not bring valuables to the hospital.  [facility] is not responsible for any belongings or valuables.  Contacts, dentures or bridgework may not be worn into surgery.  Leave your suitcase in the car.  After surgery it may be brought to your room.  For patients admitted to the hospital, discharge time will be determined by your treatment team.  Patients discharged the day of surgery will not be allowed to drive home.   Name and phone number of your driver:   family Special instructions:  none  Please read over the following fact sheets that you were given. Pain Booklet, Coughing and Deep Breathing, Surgical Site Infection Prevention, Anesthesia Post-op Instructions and Care and Recovery After Surgery      Conization of the Cervix Cervical conization is the cutting (excision) of a cone-shaped portion of the cervix. The procedure is performed through the vagina in either your health care provider's office or an operating room. This procedure is usually done when there is abnormal bleeding from the cervix. It can also be done to evaluate an abnormal Pap test or if an abnormality is seen on the cervix during an exam. The tissue is then examined to see if there are precancerous cells or cancer present.  Conization of the cervix is not done during a menstrual period or pregnancy.  LET [facility] CARE PROVIDER KNOW ABOUT:  Any  allergies you have.   All medicines you are taking, including vitamins, herbs, eye drops, creams, and over-the-counter medicines.   Previous problems you or members of your family have had with the use of anesthetics.   Any blood disorders you have.   Previous surgeries you have had.   Medical conditions you have.   Your smoking habits.   The possibility of being pregnant.  RISKS AND COMPLICATIONS  Generally, conization of the cervix is a safe procedure. However, as with any procedure, complications can occur. Possible complications include:  Heavy bleeding several days or weeks after the procedure. Light bleeding or spotting after the procedure is normal.  Infection (rare).  Damage to the cervix or surrounding organs (uncommon).   Problems with the anesthesia.   Increased risk of preterm labor in future pregnancies. BEFORE THE PROCEDURE  Do not eat or drink anything for 6-8 hours before the procedure.   Do not take aspirin or blood thinners for at least a week before the procedure or as directed by your health care provider.   Arrange for someone to take you home after the procedure.  PROCEDURE There are three different methods to perform conization of the cervix. These include:   The cold knife method. In this method a small cone-shaped sample of tissue is cut out with a knife (scalpel) from the cervical canal and the transformation zone (where the normal  cells end and the abnormal cells begin).   The LEEP method. In this method a small cone-shaped sample of tissue is cut out with a thin wire that can burn (cauterize) the cervical tissue with an electrical current.   Laser treatment. In this method a small cone-shaped sample of tissue is cut out and then cauterized with a laser beam to prevent bleeding.  The procedure will be performed as follows:   Depending on the method, you will either be given a medicine to make you sleep (general anesthetic) or a  numbing medicine (local anesthetic). A medicine that numbs the cervix (cervical block) may be given.   A lubricated device called a speculum will be inserted into the vagina to spread open the walls of the vagina. This will help your health care provider see the inside of the vagina and cervix better.   The tissue from the cervix will be removed and examined.   The results of the procedure will help your health care provider decide if further treatment is necessary. They will also help your health care provider decide on the best treatment if your results are abnormal. AFTER THE PROCEDURE  If you had a general anesthetic, you may be groggy for 2-3 hours after the procedure.   If you had a local anesthetic, you will rest at the clinic or hospital until you are stable and feel ready to go home.   Recovery may take up to 3 weeks.   You may have some cramping for about 1 week.   You may have bloody discharge or light bleeding for 1-2 weeks.   You may have black discharge coming from the vagina. This is from the paste used on the cervix to prevent bleeding. This is normal discharge.    This information is not intended to replace advice given to you by your health care provider. Make sure you discuss any questions you have with your health care provider.   Document Released: 06/15/2005 Document Revised: 09/10/2013 Document Reviewed: 03/01/2013 Elsevier Interactive Patient Education 2016 [employer] PATIENT INSTRUCTIONS POST-ANESTHESIA  IMMEDIATELY FOLLOWING SURGERY:  Do not drive or operate machinery for the first twenty four hours after surgery.  Do not make any important decisions for twenty four hours after surgery or while taking narcotic pain medications or sedatives.  If you develop intractable nausea and vomiting or a severe headache please notify your doctor immediately.  FOLLOW-UP:  Please make an appointment with your surgeon as instructed. You do not need to follow up  with anesthesia unless specifically instructed to do so.  WOUND CARE INSTRUCTIONS (if applicable):  Keep a dry clean dressing on the anesthesia/puncture wound site if there is drainage.  Once the wound has quit draining you may leave it open to air.  Generally you should leave the bandage intact for twenty four hours unless there is drainage.  If the epidural site drains for more than 36-48 hours please call the anesthesia department.  QUESTIONS?:  Please feel free to call your physician or the hospital operator if you have any questions, and they will be happy to assist you.

## 2015-08-19 ENCOUNTER — Ambulatory Visit (HOSPITAL_COMMUNITY): Payer: 59 | Admitting: Anesthesiology

## 2015-08-19 ENCOUNTER — Ambulatory Visit (HOSPITAL_COMMUNITY)
Admission: RE | Admit: 2015-08-19 | Discharge: 2015-08-19 | Disposition: A | Discharge status: Home / Self Care | Payer: 59 | Source: Ambulatory Visit | Attending: Obstetrics & Gynecology | Admitting: Obstetrics & Gynecology

## 2015-08-19 ENCOUNTER — Encounter (HOSPITAL_COMMUNITY): Payer: Self-pay | Admitting: *Deleted

## 2015-08-19 ENCOUNTER — Encounter (HOSPITAL_COMMUNITY)
Admission: RE | Disposition: A | Discharge status: Home / Self Care | Payer: Self-pay | Source: Ambulatory Visit | Attending: Obstetrics & Gynecology

## 2015-08-19 DIAGNOSIS — Z79899 Other long term (current) drug therapy: Secondary | ICD-10-CM | POA: Insufficient documentation

## 2015-08-19 DIAGNOSIS — K219 Gastro-esophageal reflux disease without esophagitis: Secondary | ICD-10-CM | POA: Insufficient documentation

## 2015-08-19 DIAGNOSIS — Z01812 Encounter for preprocedural laboratory examination: Secondary | ICD-10-CM | POA: Insufficient documentation

## 2015-08-19 DIAGNOSIS — R87612 Low grade squamous intraepithelial lesion on cytologic smear of cervix (LGSIL): Secondary | ICD-10-CM | POA: Insufficient documentation

## 2015-08-19 DIAGNOSIS — N870 Mild cervical dysplasia: Secondary | ICD-10-CM | POA: Diagnosis not present

## 2015-08-19 DIAGNOSIS — F411 Generalized anxiety disorder: Secondary | ICD-10-CM | POA: Insufficient documentation

## 2015-08-19 HISTORY — PX: CERVICAL CONIZATION W/BX: SHX1330

## 2015-08-19 HISTORY — DX: High grade squamous intraepithelial lesion on cytologic smear of cervix (HGSIL): R87.613

## 2015-08-19 HISTORY — PX: HOLMIUM LASER APPLICATION: SHX5852

## 2015-08-19 SURGERY — CONE BIOPSY, CERVIX
Anesthesia: General

## 2015-08-19 MED ORDER — FERRIC SUBSULFATE 259 MG/GM EX SOLN
CUTANEOUS | Status: AC
  Filled 2015-08-19: qty 8

## 2015-08-19 MED ORDER — ONDANSETRON HCL 4 MG/2ML IJ SOLN: 4.0000 mg | Freq: Once | INTRAMUSCULAR | Status: DC | PRN

## 2015-08-19 MED ORDER — DEXAMETHASONE SODIUM PHOSPHATE 4 MG/ML IJ SOLN
4.0000 mg | Freq: Once | INTRAMUSCULAR | Status: AC
  Administered 2015-08-19: 4 mg via INTRAVENOUS

## 2015-08-19 MED ORDER — CEFAZOLIN SODIUM-DEXTROSE 2-3 GM-% IV SOLR
INTRAVENOUS | Status: AC
  Filled 2015-08-19: qty 50

## 2015-08-19 MED ORDER — FENTANYL CITRATE (PF) 250 MCG/5ML IJ SOLN
INTRAMUSCULAR | Status: DC | PRN
  Administered 2015-08-19 (×2): 25 ug via INTRAVENOUS
  Administered 2015-08-19: 50 ug via INTRAVENOUS

## 2015-08-19 MED ORDER — LIDOCAINE HCL (PF) 1 % IJ SOLN
INTRAMUSCULAR | Status: AC
  Filled 2015-08-19: qty 5

## 2015-08-19 MED ORDER — DEXAMETHASONE SODIUM PHOSPHATE 4 MG/ML IJ SOLN
INTRAMUSCULAR | Status: AC
  Filled 2015-08-19: qty 1

## 2015-08-19 MED ORDER — ONDANSETRON HCL 4 MG/2ML IJ SOLN
INTRAMUSCULAR | Status: AC
  Filled 2015-08-19: qty 2

## 2015-08-19 MED ORDER — MIDAZOLAM HCL 2 MG/2ML IJ SOLN
1.0000 mg | INTRAMUSCULAR | Status: DC | PRN
  Administered 2015-08-19: 2 mg via INTRAVENOUS

## 2015-08-19 MED ORDER — FENTANYL CITRATE (PF) 250 MCG/5ML IJ SOLN
INTRAMUSCULAR | Status: AC
  Filled 2015-08-19: qty 5

## 2015-08-19 MED ORDER — KETOROLAC TROMETHAMINE 30 MG/ML IJ SOLN
INTRAMUSCULAR | Status: AC
  Filled 2015-08-19: qty 1

## 2015-08-19 MED ORDER — PROPOFOL 10 MG/ML IV BOLUS
INTRAVENOUS | Status: AC
  Filled 2015-08-19: qty 20

## 2015-08-19 MED ORDER — ONDANSETRON HCL 4 MG/2ML IJ SOLN
INTRAMUSCULAR | Status: DC | PRN
  Administered 2015-08-19 (×2): 4 mg via INTRAVENOUS

## 2015-08-19 MED ORDER — CEFAZOLIN SODIUM-DEXTROSE 2-3 GM-% IV SOLR
2.0000 g | INTRAVENOUS | Status: AC
Start: 2015-08-19 — End: 2015-08-19
  Administered 2015-08-19: 2 g via INTRAVENOUS

## 2015-08-19 MED ORDER — ONDANSETRON 8 MG PO TBDP: 8.0000 mg | ORAL_TABLET | Freq: Three times a day (TID) | ORAL | Status: DC | PRN

## 2015-08-19 MED ORDER — SCOPOLAMINE 1 MG/3DAYS TD PT72
1.0000 | MEDICATED_PATCH | Freq: Once | TRANSDERMAL | Status: DC
  Administered 2015-08-19: 1.5 mg via TRANSDERMAL

## 2015-08-19 MED ORDER — PROPOFOL 10 MG/ML IV BOLUS
INTRAVENOUS | Status: DC | PRN
  Administered 2015-08-19: 150 mg via INTRAVENOUS

## 2015-08-19 MED ORDER — LIDOCAINE HCL (CARDIAC) 20 MG/ML IV SOLN
INTRAVENOUS | Status: DC | PRN
  Administered 2015-08-19: 40 mg via INTRAVENOUS

## 2015-08-19 MED ORDER — ACETIC ACID 5 % SOLN
Status: DC | PRN
  Administered 2015-08-19: 1 via TOPICAL

## 2015-08-19 MED ORDER — FENTANYL CITRATE (PF) 100 MCG/2ML IJ SOLN: 25.0000 ug | INTRAMUSCULAR | Status: DC | PRN

## 2015-08-19 MED ORDER — FERRIC SUBSULFATE SOLN
Status: DC | PRN
Start: 2015-08-19 — End: 2015-08-19
  Administered 2015-08-19: 2 via TOPICAL

## 2015-08-19 MED ORDER — KETOROLAC TROMETHAMINE 10 MG PO TABS: 10.0000 mg | ORAL_TABLET | Freq: Three times a day (TID) | ORAL | Status: DC | PRN

## 2015-08-19 MED ORDER — HYDROCODONE-ACETAMINOPHEN 5-325 MG PO TABS: 1.0000 | ORAL_TABLET | Freq: Four times a day (QID) | ORAL | Status: DC | PRN

## 2015-08-19 MED ORDER — SCOPOLAMINE 1 MG/3DAYS TD PT72
MEDICATED_PATCH | TRANSDERMAL | Status: AC
  Filled 2015-08-19: qty 1

## 2015-08-19 MED ORDER — KETOROLAC TROMETHAMINE 30 MG/ML IJ SOLN
30.0000 mg | Freq: Once | INTRAMUSCULAR | Status: AC
  Administered 2015-08-19: 30 mg via INTRAVENOUS

## 2015-08-19 MED ORDER — MIDAZOLAM HCL 2 MG/2ML IJ SOLN
INTRAMUSCULAR | Status: AC
  Filled 2015-08-19: qty 2

## 2015-08-19 MED ORDER — LACTATED RINGERS IV SOLN
INTRAVENOUS | Status: DC
  Administered 2015-08-19: 07:00:00 via INTRAVENOUS

## 2015-08-19 MED ORDER — WATER FOR IRRIGATION, STERILE IR SOLN
Status: DC | PRN
  Administered 2015-08-19: 1000 mL

## 2015-08-19 SURGICAL SUPPLY — 27 items
APPLICATOR COTTON TIP 6IN STRL (MISCELLANEOUS) ×3 IMPLANT
CLOTH BEACON ORANGE TIMEOUT ST (SAFETY) ×3 IMPLANT
COAGULATOR SUCT 6 FR SWTCH (ELECTROSURGICAL) ×1
COAGULATOR SUCT SWTCH 10FR 6 (ELECTROSURGICAL) ×2 IMPLANT
COVER LIGHT HANDLE STERIS (MISCELLANEOUS) ×6 IMPLANT
ELECT REM PT RETURN 9FT ADLT (ELECTROSURGICAL) ×3
ELECTRODE REM PT RTRN 9FT ADLT (ELECTROSURGICAL) ×1 IMPLANT
FORMALIN 10 PREFIL 120ML (MISCELLANEOUS) ×3 IMPLANT
GAUZE SPONGE 4X4 16PLY XRAY LF (GAUZE/BANDAGES/DRESSINGS) ×3 IMPLANT
GLOVE BIOGEL PI IND STRL 7.0 (GLOVE) ×1 IMPLANT
GLOVE BIOGEL PI IND STRL 8 (GLOVE) ×1 IMPLANT
GLOVE BIOGEL PI INDICATOR 7.0 (GLOVE) ×6
GLOVE BIOGEL PI INDICATOR 8 (GLOVE) ×2
GLOVE ECLIPSE 8.0 STRL XLNG CF (GLOVE) ×3 IMPLANT
GOWN STRL REUS W/TWL LRG LVL3 (GOWN DISPOSABLE) ×3 IMPLANT
GOWN STRL REUS W/TWL XL LVL3 (GOWN DISPOSABLE) ×5 IMPLANT
KIT ROOM TURNOVER APOR (KITS) ×3 IMPLANT
LASER FIBER DISP 1000U (UROLOGICAL SUPPLIES) ×3 IMPLANT
MANIFOLD NEPTUNE II (INSTRUMENTS) ×3 IMPLANT
NS IRRIG 1000ML POUR BTL (IV SOLUTION) ×3 IMPLANT
PACK BASIC III (CUSTOM PROCEDURE TRAY) ×3
PACK SRG BSC III STRL LF ECLPS (CUSTOM PROCEDURE TRAY) ×1 IMPLANT
PAD ARMBOARD 7.5X6 YLW CONV (MISCELLANEOUS) ×3 IMPLANT
PREFILTER SMOKE EVAC (FILTER) ×3 IMPLANT
TOWEL OR 17X26 4PK STRL BLUE (TOWEL DISPOSABLE) ×3 IMPLANT
TUBING SMOKE EVAC CO2 (TUBING) ×3 IMPLANT
WATER STERILE IRR 1000ML POUR (IV SOLUTION) ×3 IMPLANT

## 2015-08-19 NOTE — Anesthesia Postprocedure Evaluation (Addendum)
Anesthesia Post Note  Patient: YEKATERINA LARAMIE  Procedure(s) Performed: Procedure(s) (LRB): LASER CONIZATION OF THE CERVIX (N/A) HOLMIUM LASER APPLICATION (N/A)  Patient location during evaluation: Short Stay Anesthesia Type: General Level of consciousness: awake and alert and oriented Pain management: pain level controlled Vital Signs Assessment: post-procedure vital signs reviewed and stable Respiratory status: spontaneous breathing Cardiovascular status: stable Postop Assessment: no signs of nausea or vomiting and adequate PO intake Anesthetic complications: no    Last Vitals:  Filed Vitals:   08/19/15 0925 08/19/15 0935  BP:  132/88  Pulse: 66 65  Temp:  36.7 C  Resp: 15 16    Last Pain:  Filed Vitals:   08/19/15 0937  PainSc: 3                  Keante Urizar

## 2015-08-19 NOTE — Transfer of Care (Signed)
Immediate Anesthesia Transfer of Care Note  Patient: Stacey Walton  Procedure(s) Performed: Procedure(s): LASER CONIZATION OF THE CERVIX (N/A) HOLMIUM LASER APPLICATION (N/A)  Patient Location: PACU  Anesthesia Type:General  Level of Consciousness: awake  Airway & Oxygen Therapy: Patient Spontanous Breathing and Patient connected to face mask oxygen  Post-op Assessment: Report given to RN and Post -op Vital signs reviewed and stable  Post vital signs: Reviewed and stable  Last Vitals:  Filed Vitals:   08/19/15 0725 08/19/15 0730  BP: 124/88 132/92  Pulse:    Temp:    Resp: 22 21    Complications: No apparent anesthesia complications

## 2015-08-19 NOTE — Discharge Instructions (Signed)
Conization of the Cervix  Cervical conization is the cutting (excision) of a cone-shaped portion of the cervix. The procedure is performed through the vagina in either your health care provider's office or an operating room. This procedure is usually done when there is abnormal bleeding from the cervix. It can also be done to evaluate an abnormal Pap test or if an abnormality is seen on the cervix during an exam. The tissue is then examined to see if there are precancerous cells or cancer present.   Conization of the cervix is not done during a menstrual period or pregnancy.   LET YOUR HEALTH CARE PROVIDER KNOW ABOUT:  · Any allergies you have.    · All medicines you are taking, including vitamins, herbs, eye drops, creams, and over-the-counter medicines.    · Previous problems you or members of your family have had with the use of anesthetics.    · Any blood disorders you have.    · Previous surgeries you have had.    · Medical conditions you have.    · Your smoking habits.    · The possibility of being pregnant.    RISKS AND COMPLICATIONS   Generally, conization of the cervix is a safe procedure. However, as with any procedure, complications can occur. Possible complications include:  · Heavy bleeding several days or weeks after the procedure. Light bleeding or spotting after the procedure is normal.  · Infection (rare).  · Damage to the cervix or surrounding organs (uncommon).    · Problems with the anesthesia.    · Increased risk of preterm labor in future pregnancies.  BEFORE THE PROCEDURE  · Do not eat or drink anything for 6-8 hours before the procedure.    · Do not take aspirin or blood thinners for at least a week before the procedure or as directed by your health care provider.    · Arrange for someone to take you home after the procedure.    PROCEDURE  There are three different methods to perform conization of the cervix. These include:   · The cold knife method. In this method a small cone-shaped sample  of tissue is cut out with a knife (scalpel) from the cervical canal and the transformation zone (where the normal cells end and the abnormal cells begin).    · The LEEP method. In this method a small cone-shaped sample of tissue is cut out with a thin wire that can burn (cauterize) the cervical tissue with an electrical current.    · Laser treatment. In this method a small cone-shaped sample of tissue is cut out and then cauterized with a laser beam to prevent bleeding.    The procedure will be performed as follows:   · Depending on the method, you will either be given a medicine to make you sleep (general anesthetic) or a numbing medicine (local anesthetic). A medicine that numbs the cervix (cervical block) may be given.    · A lubricated device called a speculum will be inserted into the vagina to spread open the walls of the vagina. This will help your health care provider see the inside of the vagina and cervix better.    · The tissue from the cervix will be removed and examined.    · The results of the procedure will help your health care provider decide if further treatment is necessary. They will also help your health care provider decide on the best treatment if your results are abnormal.  AFTER THE PROCEDURE  · If you had a general anesthetic, you may be groggy for 2-3 hours after   the procedure.    · If you had a local anesthetic, you will rest at the clinic or hospital until you are stable and feel ready to go home.    · Recovery may take up to 3 weeks.    · You may have some cramping for about 1 week.    · You may have bloody discharge or light bleeding for 1-2 weeks.    · You may have black discharge coming from the vagina. This is from the paste used on the cervix to prevent bleeding. This is normal discharge.       This information is not intended to replace advice given to you by your health care provider. Make sure you discuss any questions you have with your health care provider.     Document  Released: 06/15/2005 Document Revised: 09/10/2013 Document Reviewed: 03/01/2013  Elsevier Interactive Patient Education ©2016 [employer]

## 2015-08-19 NOTE — Anesthesia Preprocedure Evaluation (Addendum)
Anesthesia Evaluation  Patient identified by MRN, date of birth, ID band Patient awake    Reviewed: Allergy & Precautions, NPO status , Patient's Chart, lab work & pertinent test results  History of Anesthesia Complications (+) PONV  Airway Mallampati: II  TM Distance: >3 FB     Dental  (+) Teeth Intact   Pulmonary    Pulmonary exam normal        Cardiovascular Normal cardiovascular exam     Neuro/Psych  Headaches, Anxiety    GI/Hepatic GERD  Medicated and Controlled,  Endo/Other    Renal/GU      Musculoskeletal   Abdominal Normal abdominal exam  (+)   Peds  Hematology   Anesthesia Other Findings   Reproductive/Obstetrics                            Anesthesia Physical Anesthesia Plan  ASA: II  Anesthesia Plan: General   Post-op Pain Management:    Induction: Intravenous  Airway Management Planned: LMA  Additional Equipment:   Intra-op Plan:   Post-operative Plan: Extubation in OR  Informed Consent: I have reviewed the patients History and Physical, chart, labs and discussed the procedure including the risks, benefits and alternatives for the proposed anesthesia with the patient or authorized representative who has indicated his/her understanding and acceptance.   Dental advisory given  Plan Discussed with: CRNA  Anesthesia Plan Comments:         Anesthesia Quick Evaluation

## 2015-08-19 NOTE — Op Note (Signed)
Preoperative diagnosis:  1. High Grade cervical Dysplasia                                         2.    Postoperative Diagnosis:  Same as above  Procedure:  Cervical conization using laser,  ablation of cervical bed using laser  Surgeon:  Jacelyn Grip MD  Anesthesia:  Laryngeal mask airway  Findings:   Today at the time of surgery a repeat colposcopy was performed using 3% acetic acid and the lesion was once again confirmed.  There  were no new findings today.  Description of operation:  Patient was taken to the operating room and placed in the supine position where she underwent laryngeal mask airway anesthesia.  She was then placed in the high lithotomy position using candy cane stirrups.  She was then draped out for a laser procedure.  The microscope was used and 3% acetic acid was placed on the cervix.  The holmium laser was then employed at a power of 2.5 and rates between 8 and 15.  I achieved a couple millimeter margin around lesions both at 12:00 and 6:00 the laser was used to perform a conization.  The specimen was removed and sent to pathology for evaluation.  As is always the case with laser I did achieve at an appropriate margin around the disease with shrinkage of the tissue during the procedure it may appear to be a positive lateral margin.  However the surgical margin is indeed clear.  I then used the laser to ablate the conization bed to a depth of 5-7 mm laterally coning down to 9 mm centrally and began getting good surgical margin.  Additional hemostasis was achieved using Monsel solution.  In the conization bed was completely hemostatic.  Blood loss for the procedure was none.  The patient received ancef and toradol preoperatively.  The patient was awakened from anesthesia and taken to the recovery room in good stable condition with all counts being correct.  She will be followed up in the office in one month for evaluation of the conization bed.  Florian Buff,  MD 08/19/2015 8:23 AM

## 2015-08-19 NOTE — H&P (Signed)
Preoperative History and Physical  Stacey Walton is a 39 y.o. No obstetric history on file. with Patient's last menstrual period was 07/31/2015. admitted for a laser conization of the cervix.  She had a Pap which was read as normal cytology with +HPV for the second year in a row.  On colposcopy my findings were concerning for HSIL and biopsy showed low grade with foci of high grade dysplasia As a result will proceed with definitive therapy and diagnostic procedure  PMH:    Past Medical History  Diagnosis Date  . Bartholin gland cyst     Recurrent (left)  . Vulvar cyst     Excised 2012  . History of anemia   . Headache disorder     Tension in neck, occiput, tense shoulders.  No migrainous features.  Takes motrin and/or tylenol and it helps usually.  MRI neck and brain in the past normal (2009)  . GERD (gastroesophageal reflux disease)   . UTI (lower urinary tract infection)   . GAD (generalized anxiety disorder)   . Broken foot     Right foot  . Anxiety     PSH:     Past Surgical History  Procedure Laterality Date  . Cesarean section    . Cystectomy  2012    Vulvar (Dr. Elonda Husky)    POb/GynH:      OB History    No data available      SH:   Social History  Substance Use Topics  . Smoking status: Never Smoker   . Smokeless tobacco: Never Used  . Alcohol Use: Yes     Comment: rarely    FH:    Family History  Problem Relation Age of Onset  . Hypertension Father   . Transient ischemic attack Father      Allergies: No Known Allergies  Medications:       Current facility-administered medications:  .  ceFAZolin (ANCEF) IVPB 2 g/50 mL premix, 2 g, Intravenous, On Call to OR, Florian Buff, MD .  ketorolac (TORADOL) 30 MG/ML injection 30 mg, 30 mg, Intravenous, Once, Florian Buff, MD .  lactated ringers infusion, , Intravenous, Continuous, Rusty Aus, MD, Last Rate: 75 mL/hr at 08/19/15 0728 .  midazolam (VERSED) injection 1-2 mg, 1-2 mg, Intravenous, Q5  min PRN, Rusty Aus, MD, 2 mg at 08/19/15 [id number] .  scopolamine (TRANSDERM-SCOP) 1 MG/3DAYS 1.5 mg, 1 patch, Transdermal, Once, Rusty Aus, MD .  water for irrigation, sterile for irrigation SOLN, , , PRN, Florian Buff, MD, 1,000 mL at 08/19/15 [id number]  Review of Systems:   Review of Systems  Constitutional: Negative for fever, chills, weight loss, malaise/fatigue and diaphoresis.  HENT: Negative for hearing loss, ear pain, nosebleeds, congestion, sore throat, neck pain, tinnitus and ear discharge.   Eyes: Negative for blurred vision, double vision, photophobia, pain, discharge and redness.  Respiratory: Negative for cough, hemoptysis, sputum production, shortness of breath, wheezing and stridor.   Cardiovascular: Negative for chest pain, palpitations, orthopnea, claudication, leg swelling and PND.  Gastrointestinal: Positive for abdominal pain. Negative for heartburn, nausea, vomiting, diarrhea, constipation, blood in stool and melena.  Genitourinary: Negative for dysuria, urgency, frequency, hematuria and flank pain.  Musculoskeletal: Negative for myalgias, back pain, joint pain and falls.  Skin: Negative for itching and rash.  Neurological: Negative for dizziness, tingling, tremors, sensory change, speech change, focal weakness, seizures, loss of consciousness, weakness and headaches.  Endo/Heme/Allergies: Negative for environmental allergies and polydipsia.  Does not bruise/bleed easily.  Psychiatric/Behavioral: Negative for depression, suicidal ideas, hallucinations, memory loss and substance abuse. The patient is not nervous/anxious and does not have insomnia.      PHYSICAL EXAM:  Pulse 66, temperature 98 F (36.7 C), temperature source Oral, resp. rate 19, height 5\' 9"  (1.753 m), weight 183 lb (83.008 kg), last menstrual period 07/31/2015.    Vitals reviewed. Constitutional: She is oriented to person, place, and time. She appears well-developed and well-nourished.    HENT:  Head: Normocephalic and atraumatic.  Right Ear: External ear normal.  Left Ear: External ear normal.  Nose: Nose normal.  Mouth/Throat: Oropharynx is clear and moist.  Eyes: Conjunctivae and EOM are normal. Pupils are equal, round, and reactive to light. Right eye exhibits no discharge. Left eye exhibits no discharge. No scleral icterus.  Neck: Normal range of motion. Neck supple. No tracheal deviation present. No thyromegaly present.  Cardiovascular: Normal rate, regular rhythm, normal heart sounds and intact distal pulses.  Exam reveals no gallop and no friction rub.   No murmur heard. Respiratory: Effort normal and breath sounds normal. No respiratory distress. She has no wheezes. She has no rales. She exhibits no tenderness.  GI: Soft. Bowel sounds are normal. She exhibits no distension and no mass. There is tenderness. There is no rebound and no guarding.  Genitourinary:       Vulva is normal without lesions Vagina is pink moist without discharge Cervix per colpo report Uterus is normal size, contour, position, consistency, mobility, non-tender Adnexa is negative with normal sized ovaries by sonogram  Musculoskeletal: Normal range of motion. She exhibits no edema and no tenderness.  Neurological: She is alert and oriented to person, place, and time. She has normal reflexes. She displays normal reflexes. No cranial nerve deficit. She exhibits normal muscle tone. Coordination normal.  Skin: Skin is warm and dry. No rash noted. No erythema. No pallor.  Psychiatric: She has a normal mood and affect. Her behavior is normal. Judgment and thought content normal.    Labs: Results for orders placed or performed during the hospital encounter of 08/17/15 (from the past 336 hour(s))  CBC   Collection Time: 08/17/15  2:30 PM  Result Value Ref Range   WBC 7.3 4.0 - 10.5 K/uL   RBC 4.36 3.87 - 5.11 MIL/uL   Hemoglobin 12.8 12.0 - 15.0 g/dL   HCT 39.2 36.0 - 46.0 %   MCV 89.9 78.0 -  100.0 fL   MCH 29.4 26.0 - 34.0 pg   MCHC 32.7 30.0 - 36.0 g/dL   RDW 13.0 11.5 - 15.5 %   Platelets 289 150 - 400 K/uL  Comprehensive metabolic panel   Collection Time: 08/17/15  2:30 PM  Result Value Ref Range   Sodium 138 135 - 145 mmol/L   Potassium 3.9 3.5 - 5.1 mmol/L   Chloride 107 101 - 111 mmol/L   CO2 22 22 - 32 mmol/L   Glucose, Bld 129 (H) 65 - 99 mg/dL   BUN 10 6 - 20 mg/dL   Creatinine, Ser 0.65 0.44 - 1.00 mg/dL   Calcium 9.1 8.9 - 10.3 mg/dL   Total Protein 7.4 6.5 - 8.1 g/dL   Albumin 4.0 3.5 - 5.0 g/dL   AST 23 15 - 41 U/L   ALT 13 (L) 14 - 54 U/L   Alkaline Phosphatase 43 38 - 126 U/L   Total Bilirubin 0.6 0.3 - 1.2 mg/dL   GFR calc non Af Amer >60 >60  mL/min   GFR calc Af Amer >60 >60 mL/min   Anion gap 9 5 - 15  hCG, quantitative, pregnancy   Collection Time: 08/17/15  2:30 PM  Result Value Ref Range   hCG, Beta Chain, Quant, S <1 <5 mIU/mL    EKG: No orders found for this or any previous visit.  Imaging Studies: Mm Screening Breast Tomo Bilateral  08/03/2015  CLINICAL DATA:  Screening. EXAM: DIGITAL SCREENING BILATERAL MAMMOGRAM WITH 3D TOMO WITH CAD COMPARISON:  Previous exam(s). ACR Breast Density Category c: The breast tissue is heterogeneously dense, which may obscure small masses. FINDINGS: There are no findings suspicious for malignancy. Images were processed with CAD. IMPRESSION: No mammographic evidence of malignancy. A result letter of this screening mammogram will be mailed directly to the patient. RECOMMENDATION: Screening mammogram in one year. (Code:SM-B-01Y) BI-RADS CATEGORY  1: Negative. Electronically Signed   By: Claudie Revering M.D.   On: 08/03/2015 13:04      Assessment: HSIL of cervix  Patient Active Problem List   Diagnosis Date Noted  . Bartholin's gland abscess 09/05/2014  . Bartholin cyst 09/03/2014  . Adjustment disorder with mixed anxiety and depressed mood 08/07/2014  . Health maintenance examination 08/07/2014  .  Gastritis 06/10/2013  . GAD (generalized anxiety disorder) 04/26/2012    Plan: Laser conization of the cervix  Arshan Jabs H 08/19/2015 7:29 AM

## 2015-08-19 NOTE — Anesthesia Procedure Notes (Signed)
Procedure Name: LMA Insertion Date/Time: 08/19/2015 7:46 AM Performed by: Tressie Stalker E Pre-anesthesia Checklist: Patient identified, Patient being monitored, Emergency Drugs available, Timeout performed and Suction available Patient Re-evaluated:Patient Re-evaluated prior to inductionOxygen Delivery Method: Circle System Utilized Preoxygenation: Pre-oxygenation with 100% oxygen Intubation Type: IV induction Ventilation: Mask ventilation without difficulty LMA: LMA inserted LMA Size: 4.0 Number of attempts: 1 Placement Confirmation: positive ETCO2 and breath sounds checked- equal and bilateral

## 2015-08-20 ENCOUNTER — Encounter (HOSPITAL_COMMUNITY): Payer: Self-pay | Admitting: Obstetrics & Gynecology

## 2015-08-26 ENCOUNTER — Encounter: Payer: Self-pay | Admitting: Obstetrics & Gynecology

## 2015-08-26 ENCOUNTER — Ambulatory Visit (INDEPENDENT_AMBULATORY_CARE_PROVIDER_SITE_OTHER): Payer: 59 | Admitting: Obstetrics & Gynecology

## 2015-08-26 VITALS — BP 130/70 | HR 72 | Wt 181.0 lb

## 2015-08-26 DIAGNOSIS — Z9889 Other specified postprocedural states: Secondary | ICD-10-CM

## 2015-08-26 NOTE — Progress Notes (Signed)
Patient ID: Stacey Walton, female   DOB: 1976/01/30, 39 y.o.   MRN: [id number]    HPI: Patient returns for routine postoperative follow-up having undergone laser conization of the cervix on 08/19/2015 The patient's immediate postoperative recovery has been unremarkable. Since hospital discharge the patient reports no problems had some bleeding and cramping yesterday.   Current Outpatient Prescriptions: ibuprofen (ADVIL,MOTRIN) 200 MG tablet, Take 600 mg by mouth every 6 (six) hours as needed for mild pain., Disp: , Rfl:  norethindrone-ethinyl estradiol (JUNEL FE 1/20) 1-20 MG-MCG tablet, TAKE 1 TABLET BY MOUTH ONCE DAILY, Disp: 84 tablet, Rfl: 3 omeprazole (PRILOSEC) 40 MG capsule, TAKE 1 CAPSULE (40 MG TOTAL) BY MOUTH DAILY., Disp: 90 capsule, Rfl: 2 clonazePAM (KLONOPIN) 0.5 MG tablet, Take 1 tablet (0.5 mg total) by mouth as needed for anxiety. (Patient not taking: Reported on 08/26/2015), Disp: 30 tablet, Rfl: 5 HYDROcodone-acetaminophen (NORCO/VICODIN) 5-325 MG tablet, Take 1 tablet by mouth every 6 (six) hours as needed. (Patient not taking: Reported on 08/26/2015), Disp: 30 tablet, Rfl: 0 ketorolac (TORADOL) 10 MG tablet, Take 1 tablet (10 mg total) by mouth every 8 (eight) hours as needed. (Patient not taking: Reported on 08/26/2015), Disp: 15 tablet, Rfl: 0 ondansetron (ZOFRAN ODT) 8 MG disintegrating tablet, Take 1 tablet (8 mg total) by mouth every 8 (eight) hours as needed for nausea or vomiting. (Patient not taking: Reported on 08/26/2015), Disp: 20 tablet, Rfl: 0  No current facility-administered medications for this visit.    Blood pressure 130/70, pulse 72, weight 181 lb (82.101 kg), last menstrual period 07/31/2015.  Physical Exam: Cervical cone bed is doing wel Monsel's is placedl   Diagnostic Tests:   Pathology: Low grade dysplasia  Impression: S/p cervicl conization   Plan: Follow up Pap 6 months  Follow up: 6  months  Florian Buff, MD

## 2015-10-02 ENCOUNTER — Other Ambulatory Visit: Payer: Self-pay | Admitting: Obstetrics and Gynecology

## 2015-10-04 NOTE — Telephone Encounter (Signed)
Will refil x 84 tabs to allow time for pt to make appt. Last visit 04/2014

## 2015-10-21 ENCOUNTER — Ambulatory Visit: Payer: 59 | Admitting: Family Medicine

## 2015-11-19 ENCOUNTER — Encounter: Payer: Self-pay | Admitting: Obstetrics & Gynecology

## 2015-11-19 ENCOUNTER — Other Ambulatory Visit: Payer: Self-pay | Admitting: Obstetrics & Gynecology

## 2015-11-19 MED ORDER — SULFAMETHOXAZOLE-TRIMETHOPRIM 800-160 MG PO TABS
1.0000 | ORAL_TABLET | Freq: Two times a day (BID) | ORAL | Status: DC
Start: 2015-11-19 — End: 2015-12-31

## 2015-12-31 ENCOUNTER — Encounter: Payer: Self-pay | Admitting: Obstetrics & Gynecology

## 2015-12-31 ENCOUNTER — Other Ambulatory Visit (HOSPITAL_COMMUNITY)
Admission: RE | Admit: 2015-12-31 | Discharge: 2015-12-31 | Disposition: A | Discharge status: Home / Self Care | Payer: 59 | Source: Ambulatory Visit | Attending: Obstetrics & Gynecology | Admitting: Obstetrics & Gynecology

## 2015-12-31 ENCOUNTER — Ambulatory Visit (INDEPENDENT_AMBULATORY_CARE_PROVIDER_SITE_OTHER): Payer: 59 | Admitting: Obstetrics & Gynecology

## 2015-12-31 VITALS — BP 118/68 | HR 62 | Ht 69.0 in | Wt 170.0 lb

## 2015-12-31 DIAGNOSIS — R896 Abnormal cytological findings in specimens from other organs, systems and tissues: Secondary | ICD-10-CM | POA: Diagnosis not present

## 2015-12-31 DIAGNOSIS — Z01419 Encounter for gynecological examination (general) (routine) without abnormal findings: Secondary | ICD-10-CM | POA: Insufficient documentation

## 2015-12-31 DIAGNOSIS — Z124 Encounter for screening for malignant neoplasm of cervix: Secondary | ICD-10-CM | POA: Diagnosis not present

## 2015-12-31 DIAGNOSIS — Z0142 Encounter for cervical smear to confirm findings of recent normal smear following initial abnormal smear: Secondary | ICD-10-CM | POA: Diagnosis not present

## 2015-12-31 DIAGNOSIS — IMO0002 Reserved for concepts with insufficient information to code with codable children: Secondary | ICD-10-CM

## 2015-12-31 NOTE — Progress Notes (Signed)
Patient ID: Stacey Walton, female   DOB: July 21, 1976, 40 y.o.   MRN: [id number] Chief Complaint  Patient presents with  . Follow-up    6 month pap    Blood pressure 118/68, pulse 62, height 5\' 9"  (1.753 m), weight 170 lb (77.111 kg), last menstrual period 12/07/2015.  Pt had laser conization of the cervix 07/2015 for possible HSIL, final path did not show that  Here for first follow up Pap smear  Pap done, follow up q 6 months for 2 years then yearly

## 2016-01-06 LAB — CYTOLOGY - PAP

## 2016-01-27 ENCOUNTER — Encounter: Payer: Self-pay | Admitting: Obstetrics & Gynecology

## 2016-01-28 ENCOUNTER — Other Ambulatory Visit: Payer: Self-pay | Admitting: Obstetrics & Gynecology

## 2016-01-28 MED ORDER — SULFAMETHOXAZOLE-TRIMETHOPRIM 800-160 MG PO TABS: 1.0000 | ORAL_TABLET | Freq: Two times a day (BID) | ORAL | Status: DC

## 2016-04-11 ENCOUNTER — Ambulatory Visit: Payer: 59 | Admitting: Adult Health

## 2016-04-11 ENCOUNTER — Encounter: Payer: Self-pay | Admitting: Obstetrics & Gynecology

## 2016-04-11 ENCOUNTER — Other Ambulatory Visit: Payer: Self-pay | Admitting: Obstetrics & Gynecology

## 2016-04-11 MED ORDER — CIPROFLOXACIN HCL 500 MG PO TABS: 500.0000 mg | ORAL_TABLET | Freq: Two times a day (BID) | ORAL | 0 refills | Status: DC

## 2016-06-05 ENCOUNTER — Other Ambulatory Visit: Payer: Self-pay | Admitting: Obstetrics and Gynecology

## 2016-06-06 ENCOUNTER — Other Ambulatory Visit: Payer: Self-pay | Admitting: Adult Health

## 2016-06-06 MED ORDER — NORETHIN ACE-ETH ESTRAD-FE 1-20 MG-MCG PO TABS: 1.0000 | ORAL_TABLET | Freq: Every day | ORAL | 0 refills | Status: DC

## 2016-07-04 ENCOUNTER — Other Ambulatory Visit: Payer: 59 | Admitting: Obstetrics & Gynecology

## 2016-07-26 ENCOUNTER — Other Ambulatory Visit (HOSPITAL_COMMUNITY)
Admission: RE | Admit: 2016-07-26 | Discharge: 2016-07-26 | Disposition: A | Discharge status: Home / Self Care | Payer: 59 | Source: Ambulatory Visit | Attending: Obstetrics & Gynecology | Admitting: Obstetrics & Gynecology

## 2016-07-26 ENCOUNTER — Encounter: Payer: Self-pay | Admitting: Obstetrics & Gynecology

## 2016-07-26 ENCOUNTER — Ambulatory Visit (INDEPENDENT_AMBULATORY_CARE_PROVIDER_SITE_OTHER): Payer: 59 | Admitting: Obstetrics & Gynecology

## 2016-07-26 VITALS — BP 110/80 | HR 72 | Ht 69.0 in | Wt 166.4 lb

## 2016-07-26 DIAGNOSIS — Z01419 Encounter for gynecological examination (general) (routine) without abnormal findings: Secondary | ICD-10-CM

## 2016-07-26 MED ORDER — NORETHIN ACE-ETH ESTRAD-FE 1-20 MG-MCG PO TABS: 1.0000 | ORAL_TABLET | Freq: Every day | ORAL | 3 refills | Status: DC

## 2016-07-26 NOTE — Progress Notes (Signed)
Subjective:     Stacey Walton is a 40 y.o. female here for a routine exam.  Patient's last menstrual period was 07/05/2016. No obstetric history on file. Birth Control Method:  OCP Menstrual Calendar(currently): regylar  Current complaints: none.   Current acute medical issues:  S/p laser conization 07/2015   Recent Gynecologic History Patient's last menstrual period was 07/05/2016. Last Pap: 4/17,  normal Last mammogram: 07/2016,  normal  Past Medical History:  Diagnosis Date  . Anxiety   . Bartholin gland cyst    Recurrent (left)  . Broken foot    Right foot  . GAD (generalized anxiety disorder)   . GERD (gastroesophageal reflux disease)   . Headache disorder    Tension in neck, occiput, tense shoulders.  No migrainous features.  Takes motrin and/or tylenol and it helps usually.  MRI neck and brain in the past normal (2009)  . High grade squamous intraepithelial cervical dysplasia    Cervical conization: CIN 1  . History of anemia   . UTI (lower urinary tract infection)   . Vulvar cyst    Excised 2012    Past Surgical History:  Procedure Laterality Date  . CERVICAL CONIZATION W/BX N/A 08/19/2015   Path: CIN 1  Procedure: LASER CONIZATION OF THE CERVIX;  Surgeon: Florian Buff, MD;  Location: [facility];  Service: Gynecology;  Laterality: N/A;  . CESAREAN SECTION    . CYSTECTOMY  2012   Vulvar (Dr. Elonda Husky)  . HOLMIUM LASER APPLICATION N/A [id number]   Procedure: HOLMIUM LASER APPLICATION;  Surgeon: Florian Buff, MD;  Location: [facility];  Service: Gynecology;  Laterality: N/A;    OB History    No data available      Social History   Social History  . Marital status: Married    Spouse name: N/A  . Number of children: N/A  . Years of education: N/A   Social History Main Topics  . Smoking status: Never Smoker  . Smokeless tobacco: Never Used  . Alcohol use Yes     Comment: rarely  . Drug use: No  . Sexual activity: Yes    Birth control/ protection: Pill    Other Topics Concern  . None   Social History Narrative   Divorced, remarried, has one teenage son.   She has an associates degree in nursing and works at [employer].   No T/A/Ds.   No significant exercise.   Normal american diet.             Family History  Problem Relation Age of Onset  . Hypertension Father   . Transient ischemic attack Father      Current Outpatient Prescriptions:  .  norethindrone-ethinyl estradiol (JUNEL FE 1/20) 1-20 MG-MCG tablet, Take 1 tablet by mouth daily., Disp: 84 tablet, Rfl: 3  Review of Systems  Review of Systems  Constitutional: Negative for fever, chills, weight loss, malaise/fatigue and diaphoresis.  HENT: Negative for hearing loss, ear pain, nosebleeds, congestion, sore throat, neck pain, tinnitus and ear discharge.   Eyes: Negative for blurred vision, double vision, photophobia, pain, discharge and redness.  Respiratory: Negative for cough, hemoptysis, sputum production, shortness of breath, wheezing and stridor.   Cardiovascular: Negative for chest pain, palpitations, orthopnea, claudication, leg swelling and PND.  Gastrointestinal: negative for abdominal pain. Negative for heartburn, nausea, vomiting, diarrhea, constipation, blood in stool and melena.  Genitourinary: Negative for dysuria, urgency, frequency, hematuria and flank pain.  Musculoskeletal: Negative for myalgias, back pain, joint pain  and falls.  Skin: Negative for itching and rash.  Neurological: Negative for dizziness, tingling, tremors, sensory change, speech change, focal weakness, seizures, loss of consciousness, weakness and headaches.  Endo/Heme/Allergies: Negative for environmental allergies and polydipsia. Does not bruise/bleed easily.  Psychiatric/Behavioral: Negative for depression, suicidal ideas, hallucinations, memory loss and substance abuse. The patient is not nervous/anxious and does not have insomnia.        Objective:  Blood pressure 110/80, pulse 72,  height 5\' 9"  (1.753 m), weight 166 lb 6.4 oz (75.5 kg), last menstrual period 07/05/2016.   Physical Exam  Vitals reviewed. Constitutional: She is oriented to person, place, and time. She appears well-developed and well-nourished.  HENT:  Head: Normocephalic and atraumatic.        Right Ear: External ear normal.  Left Ear: External ear normal.  Nose: Nose normal.  Mouth/Throat: Oropharynx is clear and moist.  Eyes: Conjunctivae and EOM are normal. Pupils are equal, round, and reactive to light. Right eye exhibits no discharge. Left eye exhibits no discharge. No scleral icterus.  Neck: Normal range of motion. Neck supple. No tracheal deviation present. No thyromegaly present.  Cardiovascular: Normal rate, regular rhythm, normal heart sounds and intact distal pulses.  Exam reveals no gallop and no friction rub.   No murmur heard. Respiratory: Effort normal and breath sounds normal. No respiratory distress. She has no wheezes. She has no rales. She exhibits no tenderness.  GI: Soft. Bowel sounds are normal. She exhibits no distension and no mass. There is no tenderness. There is no rebound and no guarding.  Genitourinary:  Breasts no masses skin changes or nipple changes bilaterally      Vulva is normal without lesions Vagina is pink moist without discharge Cervix normal in appearance and pap is done Uterus is normal size shape and contour Adnexa is negative with normal sized ovaries   Musculoskeletal: Normal range of motion. She exhibits no edema and no tenderness.  Neurological: She is alert and oriented to person, place, and time. She has normal reflexes. She displays normal reflexes. No cranial nerve deficit. She exhibits normal muscle tone. Coordination normal.  Skin: Skin is warm and dry. No rash noted. No erythema. No pallor.  Psychiatric: She has a normal mood and affect. Her behavior is normal. Judgment and thought content normal.       Medications Ordered at today's visit: Meds  ordered this encounter  Medications  . norethindrone-ethinyl estradiol (JUNEL FE 1/20) 1-20 MG-MCG tablet    Sig: Take 1 tablet by mouth daily.    Dispense:  84 tablet    Refill:  3    Other orders placed at today's visit: No orders of the defined types were placed in this encounter.     Assessment:    Healthy female exam.    Plan:    Contraception: OCP (estrogen/progesterone). Mammogram ordered. Follow up in: 6 months.   interval Pap   Return in about 6 months (around 01/23/2017) for Follow up Pap, with Dr Elonda Husky.

## 2016-07-28 LAB — CYTOLOGY - PAP: DIAGNOSIS: NEGATIVE

## 2016-09-14 ENCOUNTER — Other Ambulatory Visit: Payer: Self-pay | Admitting: Obstetrics & Gynecology

## 2016-09-14 DIAGNOSIS — Z1231 Encounter for screening mammogram for malignant neoplasm of breast: Secondary | ICD-10-CM

## 2016-09-23 ENCOUNTER — Ambulatory Visit (HOSPITAL_COMMUNITY)
Admission: RE | Admit: 2016-09-23 | Discharge: 2016-09-23 | Disposition: A | Discharge status: Home / Self Care | Payer: 59 | Source: Ambulatory Visit | Attending: Obstetrics & Gynecology | Admitting: Obstetrics & Gynecology

## 2016-09-23 DIAGNOSIS — Z1231 Encounter for screening mammogram for malignant neoplasm of breast: Secondary | ICD-10-CM | POA: Insufficient documentation

## 2017-01-23 ENCOUNTER — Other Ambulatory Visit: Payer: 59 | Admitting: Obstetrics & Gynecology

## 2017-02-02 ENCOUNTER — Encounter: Payer: Self-pay | Admitting: Obstetrics & Gynecology

## 2017-02-02 ENCOUNTER — Other Ambulatory Visit (HOSPITAL_COMMUNITY)
Admission: RE | Admit: 2017-02-02 | Discharge: 2017-02-02 | Disposition: A | Discharge status: Home / Self Care | Payer: 59 | Source: Ambulatory Visit | Attending: Obstetrics & Gynecology | Admitting: Obstetrics & Gynecology

## 2017-02-02 ENCOUNTER — Ambulatory Visit (INDEPENDENT_AMBULATORY_CARE_PROVIDER_SITE_OTHER): Payer: 59 | Admitting: Obstetrics & Gynecology

## 2017-02-02 VITALS — BP 136/66 | HR 68 | Ht 69.0 in | Wt 174.0 lb

## 2017-02-02 DIAGNOSIS — Z01419 Encounter for gynecological examination (general) (routine) without abnormal findings: Secondary | ICD-10-CM | POA: Insufficient documentation

## 2017-02-02 DIAGNOSIS — R87613 High grade squamous intraepithelial lesion on cytologic smear of cervix (HGSIL): Secondary | ICD-10-CM | POA: Diagnosis not present

## 2017-02-02 DIAGNOSIS — Z9889 Other specified postprocedural states: Secondary | ICD-10-CM

## 2017-02-02 NOTE — Progress Notes (Signed)
Chief Complaint  Patient presents with  . Annual Exam    6 month f/u pap    Blood pressure 136/66, pulse 68, height 5\' 9"  (1.753 m), weight 174 lb (78.9 kg), last menstrual period 01/11/2017.  41 y.o. No obstetric history on file. Patient's last menstrual period was 01/11/2017. The current method of family planning is OCP (estrogen/progesterone).  Outpatient Encounter Prescriptions as of 02/02/2017  Medication Sig  . norethindrone-ethinyl estradiol (JUNEL FE 1/20) 1-20 MG-MCG tablet Take 1 tablet by mouth daily.   No facility-administered encounter medications on file as of 02/02/2017.     Subjective Pt is s/p laser conization of the cervix 08/2015 for HSIL with subsequent Pap smear evals being normal No complaints today  Objective General WDWN female NAD Vulva:  normal appearing vulva with no masses, tenderness or lesions Vagina:  normal mucosa, no discharge Cervix:  no bleeding following Pap, no cervical motion tenderness and no lesions Uterus:  normal size, contour, position, consistency, mobility, non-tender Adnexa: ovaries:present,  normal adnexa in size, nontender and no masses   Pertinent ROS No burning with urination, frequency or urgency No nausea, vomiting or diarrhea Nor fever chills or other constitutional symptoms   Labs or studies Previous Pap smears are normal post op    Impression Diagnoses this Encounter::   ICD-9-CM ICD-10-CM   1. High grade squamous intraepithelial cervical dysplasia 795.04 R87.613   2. Encounter for cervical Pap smear with pelvic exam V76.2 Z01.419 Cytology - PAP   V72.31    3. S/P conization of cervix V45.89 Z98.890    07/2015    Established relevant diagnosis(es):   Plan/Recommendations: No orders of the defined types were placed in this encounter.   Labs or Scans Ordered: No orders of the defined types were placed in this encounter.   Management:: If normal follow up cytology in 1 year  Follow up Return  in about 1 year (around 02/02/2018) for yearly.    All questions were answered.  Past Medical History:  Diagnosis Date  . Anxiety   . Bartholin gland cyst    Recurrent (left)  . Broken foot    Right foot  . GAD (generalized anxiety disorder)   . GERD (gastroesophageal reflux disease)   . Headache disorder    Tension in neck, occiput, tense shoulders.  No migrainous features.  Takes motrin and/or tylenol and it helps usually.  MRI neck and brain in the past normal (2009)  . High grade squamous intraepithelial cervical dysplasia    Cervical conization: CIN 1  . History of anemia   . UTI (lower urinary tract infection)   . Vulvar cyst    Excised 2012    Past Surgical History:  Procedure Laterality Date  . CERVICAL CONIZATION W/BX N/A 08/19/2015   Path: CIN 1  Procedure: LASER CONIZATION OF THE CERVIX;  Surgeon: Florian Buff, MD;  Location: [facility];  Service: Gynecology;  Laterality: N/A;  . CESAREAN SECTION    . CYSTECTOMY  2012   Vulvar (Dr. Elonda Husky)  . HOLMIUM LASER APPLICATION N/A 65/68/1275   Procedure: HOLMIUM LASER APPLICATION;  Surgeon: Florian Buff, MD;  Location: [facility];  Service: Gynecology;  Laterality: N/A;    OB History    No data available      No Known Allergies  Social History   Social History  . Marital status: Married    Spouse name: N/A  . Number of children: N/A  . Years of education: N/A  Social History Main Topics  . Smoking status: Never Smoker  . Smokeless tobacco: Never Used  . Alcohol use Yes     Comment: rarely  . Drug use: No  . Sexual activity: Yes    Birth control/ protection: Pill   Other Topics Concern  . None   Social History Narrative   Divorced, remarried, has one teenage son.   She has an associates degree in nursing and works at [employer].   No T/A/Ds.   No significant exercise.   Normal american diet.             Family History  Problem Relation Age of Onset  . Hypertension Father   . Transient ischemic attack  Father   . Stroke Father   . Heart attack Father

## 2017-02-08 LAB — CYTOLOGY - PAP
DIAGNOSIS: NEGATIVE
HPV: NOT DETECTED

## 2017-02-21 ENCOUNTER — Other Ambulatory Visit: Payer: Self-pay | Admitting: Dermatology

## 2017-02-24 ENCOUNTER — Encounter: Payer: Self-pay | Admitting: Family Medicine

## 2017-09-17 ENCOUNTER — Other Ambulatory Visit: Payer: Self-pay | Admitting: Obstetrics & Gynecology

## 2017-10-04 ENCOUNTER — Other Ambulatory Visit: Payer: Self-pay | Admitting: Obstetrics & Gynecology

## 2017-10-04 DIAGNOSIS — Z1231 Encounter for screening mammogram for malignant neoplasm of breast: Secondary | ICD-10-CM

## 2017-10-05 ENCOUNTER — Ambulatory Visit (HOSPITAL_COMMUNITY)
Admission: RE | Admit: 2017-10-05 | Discharge: 2017-10-05 | Disposition: A | Discharge status: Home / Self Care | Payer: 59 | Source: Ambulatory Visit | Attending: Obstetrics & Gynecology | Admitting: Obstetrics & Gynecology

## 2017-10-05 DIAGNOSIS — Z1231 Encounter for screening mammogram for malignant neoplasm of breast: Secondary | ICD-10-CM | POA: Insufficient documentation

## 2018-03-20 ENCOUNTER — Encounter: Payer: Self-pay | Admitting: Obstetrics & Gynecology

## 2018-03-20 ENCOUNTER — Ambulatory Visit (INDEPENDENT_AMBULATORY_CARE_PROVIDER_SITE_OTHER): Payer: 59 | Admitting: Obstetrics & Gynecology

## 2018-03-20 ENCOUNTER — Other Ambulatory Visit (HOSPITAL_COMMUNITY)
Admission: RE | Admit: 2018-03-20 | Discharge: 2018-03-20 | Disposition: A | Discharge status: Home / Self Care | Payer: 59 | Source: Ambulatory Visit | Attending: Obstetrics & Gynecology | Admitting: Obstetrics & Gynecology

## 2018-03-20 VITALS — BP 138/92 | HR 74 | Ht 68.0 in | Wt 173.0 lb

## 2018-03-20 DIAGNOSIS — Z01419 Encounter for gynecological examination (general) (routine) without abnormal findings: Secondary | ICD-10-CM | POA: Diagnosis not present

## 2018-03-20 DIAGNOSIS — F329 Major depressive disorder, single episode, unspecified: Secondary | ICD-10-CM

## 2018-03-20 MED ORDER — ESCITALOPRAM OXALATE 20 MG PO TABS: 20.0000 mg | ORAL_TABLET | Freq: Every day | ORAL | 11 refills | Status: DC

## 2018-03-20 MED ORDER — NORETHIN ACE-ETH ESTRAD-FE 1-20 MG-MCG PO TABS: 1.0000 | ORAL_TABLET | Freq: Every day | ORAL | 3 refills | Status: DC

## 2018-03-20 NOTE — Progress Notes (Signed)
Subjective:     Stacey Walton is a 42 y.o. female here for a routine exam.  Patient's last menstrual period was 03/14/2018 (approximate). G1P0101 Birth Control Method:  OCP Menstrual Calendar(currently): regular  Current complaints: depression anxiety life issues.   Current acute medical issues:     Recent Gynecologic History Patient's last menstrual period was 03/14/2018 (approximate). Last Pap: 2018,  normal Last mammogram: 2019,  normal  Past Medical History:  Diagnosis Date  . Anxiety   . Bartholin gland cyst    Recurrent (left)  . Broken foot    Right foot  . GAD (generalized anxiety disorder)   . GERD (gastroesophageal reflux disease)   . Headache disorder    Tension in neck, occiput, tense shoulders.  No migrainous features.  Takes motrin and/or tylenol and it helps usually.  MRI neck and brain in the past normal (2009)  . High grade squamous intraepithelial cervical dysplasia 2016   Cervical conization: CIN 1.  Subsequent pap's NEG.  . History of anemia   . UTI (lower urinary tract infection)   . Vulvar cyst    Excised 2012    Past Surgical History:  Procedure Laterality Date  . CERVICAL CONIZATION W/BX N/A 08/19/2015   Path: CIN 1  Procedure: LASER CONIZATION OF THE CERVIX;  Surgeon: Florian Buff, MD;  Location: [facility];  Service: Gynecology;  Laterality: N/A;  . CESAREAN SECTION    . CYSTECTOMY  2012   Vulvar (Dr. Elonda Husky)  . HOLMIUM LASER APPLICATION N/A 16/06/9603   Procedure: HOLMIUM LASER APPLICATION;  Surgeon: Florian Buff, MD;  Location: [facility];  Service: Gynecology;  Laterality: N/A;    OB History    Gravida  1   Para  1   Term      Preterm  1   AB      Living  1     SAB      TAB      Ectopic      Multiple      Live Births  1           Social History   Socioeconomic History  . Marital status: Married    Spouse name: Not on file  . Number of children: Not on file  . Years of education: Not on file  . Highest education  level: Not on file  Occupational History  . Not on file  Social Needs  . Financial resource strain: Not on file  . Food insecurity:    Worry: Not on file    Inability: Not on file  . Transportation needs:    Medical: Not on file    Non-medical: Not on file  Tobacco Use  . Smoking status: Never Smoker  . Smokeless tobacco: Never Used  Substance and Sexual Activity  . Alcohol use: Yes    Comment: rarely  . Drug use: No  . Sexual activity: Yes    Birth control/protection: Pill  Lifestyle  . Physical activity:    Days per week: Not on file    Minutes per session: Not on file  . Stress: Not on file  Relationships  . Social connections:    Talks on phone: Not on file    Gets together: Not on file    Attends religious service: Not on file    Active member of club or organization: Not on file    Attends meetings of clubs or organizations: Not on file    Relationship status:  Not on file  Other Topics Concern  . Not on file  Social History Narrative   Divorced, remarried, has one teenage son.   She has an associates degree in nursing and works at [employer].   No T/A/Ds.   No significant exercise.   Normal american diet.          Family History  Problem Relation Age of Onset  . Hypertension Father   . Transient ischemic attack Father   . Stroke Father   . Heart attack Father      Current Outpatient Medications:  .  norethindrone-ethinyl estradiol (JUNEL FE 1/20) 1-20 MG-MCG tablet, Take 1 tablet by mouth daily., Disp: 84 tablet, Rfl: 3 .  PARoxetine (PAXIL) 20 MG tablet, Take 20 mg by mouth daily., Disp: , Rfl:  .  escitalopram (LEXAPRO) 20 MG tablet, Take 1 tablet (20 mg total) by mouth daily., Disp: 30 tablet, Rfl: 11  Review of Systems  Review of Systems  Constitutional: Negative for fever, chills, weight loss, malaise/fatigue and diaphoresis.  HENT: Negative for hearing loss, ear pain, nosebleeds, congestion, sore throat, neck pain, tinnitus and ear discharge.    Eyes: Negative for blurred vision, double vision, photophobia, pain, discharge and redness.  Respiratory: Negative for cough, hemoptysis, sputum production, shortness of breath, wheezing and stridor.   Cardiovascular: Negative for chest pain, palpitations, orthopnea, claudication, leg swelling and PND.  Gastrointestinal: negative for abdominal pain. Negative for heartburn, nausea, vomiting, diarrhea, constipation, blood in stool and melena.  Genitourinary: Negative for dysuria, urgency, frequency, hematuria and flank pain.  Musculoskeletal: Negative for myalgias, back pain, joint pain and falls.  Skin: Negative for itching and rash.  Neurological: Negative for dizziness, tingling, tremors, sensory change, speech change, focal weakness, seizures, loss of consciousness, weakness and headaches.  Endo/Heme/Allergies: Negative for environmental allergies and polydipsia. Does not bruise/bleed easily.  Psychiatric/Behavioral: Negative for depression, suicidal ideas, hallucinations, memory loss and substance abuse. The patient is not nervous/anxious and does not have insomnia.        Objective:  Blood pressure (!) 138/92, pulse 74, height 5\' 8"  (1.727 m), weight 173 lb (78.5 kg), last menstrual period 03/14/2018.   Physical Exam  Vitals reviewed. Constitutional: She is oriented to person, place, and time. She appears well-developed and well-nourished.  HENT:  Head: Normocephalic and atraumatic.        Right Ear: External ear normal.  Left Ear: External ear normal.  Nose: Nose normal.  Mouth/Throat: Oropharynx is clear and moist.  Eyes: Conjunctivae and EOM are normal. Pupils are equal, round, and reactive to light. Right eye exhibits no discharge. Left eye exhibits no discharge. No scleral icterus.  Neck: Normal range of motion. Neck supple. No tracheal deviation present. No thyromegaly present.  Cardiovascular: Normal rate, regular rhythm, normal heart sounds and intact distal pulses.  Exam  reveals no gallop and no friction rub.   No murmur heard. Respiratory: Effort normal and breath sounds normal. No respiratory distress. She has no wheezes. She has no rales. She exhibits no tenderness.  GI: Soft. Bowel sounds are normal. She exhibits no distension and no mass. There is no tenderness. There is no rebound and no guarding.  Genitourinary:  Breasts no masses skin changes or nipple changes bilaterally      Vulva is normal without lesions Vagina is pink moist without discharge Cervix normal in appearance and pap is done Uterus is normal size shape and contour Adnexa is negative with normal sized ovaries   Musculoskeletal: Normal range of  motion. She exhibits no edema and no tenderness.  Neurological: She is alert and oriented to person, place, and time. She has normal reflexes. She displays normal reflexes. No cranial nerve deficit. She exhibits normal muscle tone. Coordination normal.  Skin: Skin is warm and dry. No rash noted. No erythema. No pallor.  Psychiatric: She has a normal mood and affect. Her behavior is normal. Judgment and thought content normal.       Medications Ordered at today's visit: Meds ordered this encounter  Medications  . escitalopram (LEXAPRO) 20 MG tablet    Sig: Take 1 tablet (20 mg total) by mouth daily.    Dispense:  30 tablet    Refill:  11  . norethindrone-ethinyl estradiol (JUNEL FE 1/20) 1-20 MG-MCG tablet    Sig: Take 1 tablet by mouth daily.    Dispense:  84 tablet    Refill:  3    Other orders placed at today's visit: No orders of the defined types were placed in this encounter.     Assessment:    Healthy female exam.   depression  Plan:    Contraception: OCP (estrogen/progesterone). Mammogram ordered. Follow up in: 1 year.     Return in about 1 year (around 03/21/2019) for yearly.

## 2018-03-22 LAB — CYTOLOGY - PAP
Diagnosis: NEGATIVE
HPV (WINDOPATH): NOT DETECTED

## 2018-03-28 ENCOUNTER — Encounter: Payer: Self-pay | Admitting: Family Medicine

## 2018-04-11 ENCOUNTER — Other Ambulatory Visit: Payer: Self-pay | Admitting: Obstetrics & Gynecology

## 2018-06-06 ENCOUNTER — Telehealth: Payer: 59 | Admitting: Family

## 2018-06-06 DIAGNOSIS — J019 Acute sinusitis, unspecified: Secondary | ICD-10-CM

## 2018-06-06 MED ORDER — AMOXICILLIN-POT CLAVULANATE 875-125 MG PO TABS
1.0000 | ORAL_TABLET | Freq: Two times a day (BID) | ORAL | 0 refills | Status: DC
Start: 2018-06-06 — End: 2018-09-13

## 2018-06-06 NOTE — Progress Notes (Signed)

## 2018-09-13 ENCOUNTER — Telehealth: Payer: 59 | Admitting: Physician Assistant

## 2018-09-13 DIAGNOSIS — R300 Dysuria: Secondary | ICD-10-CM

## 2018-09-13 MED ORDER — CEPHALEXIN 500 MG PO CAPS: 500.0000 mg | ORAL_CAPSULE | Freq: Two times a day (BID) | ORAL | 0 refills | Status: AC

## 2018-09-13 NOTE — Progress Notes (Signed)

## 2019-02-07 ENCOUNTER — Other Ambulatory Visit (HOSPITAL_COMMUNITY): Payer: Self-pay | Admitting: Obstetrics & Gynecology

## 2019-02-07 DIAGNOSIS — Z1231 Encounter for screening mammogram for malignant neoplasm of breast: Secondary | ICD-10-CM

## 2019-02-14 ENCOUNTER — Other Ambulatory Visit: Payer: Self-pay

## 2019-02-14 ENCOUNTER — Ambulatory Visit (HOSPITAL_COMMUNITY)
Admission: RE | Admit: 2019-02-14 | Discharge: 2019-02-14 | Disposition: A | Discharge status: Home / Self Care | Payer: 59 | Source: Ambulatory Visit | Attending: Obstetrics & Gynecology | Admitting: Obstetrics & Gynecology

## 2019-02-14 DIAGNOSIS — Z1231 Encounter for screening mammogram for malignant neoplasm of breast: Secondary | ICD-10-CM | POA: Diagnosis present

## 2019-05-06 ENCOUNTER — Other Ambulatory Visit: Payer: Self-pay | Admitting: Obstetrics & Gynecology

## 2019-05-07 ENCOUNTER — Other Ambulatory Visit: Payer: Self-pay | Admitting: Obstetrics & Gynecology

## 2019-08-06 ENCOUNTER — Telehealth: Payer: 59

## 2019-08-07 ENCOUNTER — Encounter: Payer: Self-pay | Admitting: Medical

## 2019-08-07 ENCOUNTER — Ambulatory Visit (INDEPENDENT_AMBULATORY_CARE_PROVIDER_SITE_OTHER): Payer: 59 | Admitting: Medical

## 2019-08-07 ENCOUNTER — Other Ambulatory Visit: Payer: Self-pay

## 2019-08-07 VITALS — BP 116/73 | HR 64 | Temp 97.4°F | Resp 16 | Ht 68.0 in | Wt 181.4 lb

## 2019-08-07 DIAGNOSIS — H1032 Unspecified acute conjunctivitis, left eye: Secondary | ICD-10-CM | POA: Diagnosis not present

## 2019-08-07 DIAGNOSIS — H00016 Hordeolum externum left eye, unspecified eyelid: Secondary | ICD-10-CM

## 2019-08-07 DIAGNOSIS — L089 Local infection of the skin and subcutaneous tissue, unspecified: Secondary | ICD-10-CM

## 2019-08-07 MED ORDER — DOXYCYCLINE HYCLATE 100 MG PO TABS: 100.0000 mg | ORAL_TABLET | Freq: Two times a day (BID) | ORAL | 0 refills | Status: DC

## 2019-08-07 MED ORDER — TOBRAMYCIN 0.3 % OP SOLN: 2.0000 [drp] | Freq: Four times a day (QID) | OPHTHALMIC | 0 refills | Status: DC

## 2019-08-07 NOTE — Patient Instructions (Addendum)
For recent  eye lid swelling, I prescribed tobrex eye drops and doxycycline. On exam does feel like stye present  but concern for upper lid infection. Also do warm compresses twice daily.  Start med asap. You have appointment already scheduled with eye MD. I would keep that appointment and make decision later if you feel siginificant improvement over next 24 before potentially cancelling if you feel unnecessary.  Follow up 5-7 days or as needed

## 2019-08-07 NOTE — Progress Notes (Signed)
   Subjective:    Patient ID: Stacey Walton, female    DOB: 09-04-1976, 43 y.o.   MRN: [id number]  HPI  Pt states 2 weeks ago her rt upper eyelid was swollen. She states she used some emycin ointment. The rt upper eyelid has waxed and waned in appearance. But overall rt upper lid better now. On right side had minimal dc at best saw little bit.   Then yesterday her rt upper eye lid started to swell and today it all the sudden left upper eye lid swollen.  Pt has appointment with opthalmolgist this week on Friday.  LMP- 3 weeks ago. On ocp.  No vision changes. No light flashes reported.   Review of Systems  Constitutional: Negative for chills, fatigue and fever.  Eyes: Negative for photophobia, pain, redness and visual disturbance.       See hpi.  Respiratory: Negative for chest tightness, shortness of breath and wheezing.   Cardiovascular: Negative for chest pain and palpitations.  Skin: Negative for rash.  Hematological: Negative for adenopathy. Does not bruise/bleed easily.  Psychiatric/Behavioral: Negative for behavioral problems and confusion.       Objective:   Physical Exam  General- No acute distress. Pleasant patient. Neck- Full range of motion, no jvd Lungs- Clear, even and unlabored. Heart- regular rate and rhythm. Neurologic- CNII- XII grossly intact. Rt eye- upper eye lid feel like small residual stye but no redness or swelling. Conjunctiva is clear.  Left eye- moderate swelling and redness. Pt report faint discomfort. Conjunctiva clear. heent- no sinus pressure on palpaitn.       Assessment & Plan:  For recent  eye lid swelling, I prescribed tobrex eye drops and doxycycline. On exam does feel like stye present  but concern for upper lid infection. Also do warm compresses twice daily.  Start med asap. You have appointment already scheduled with eye MD. I would keep that appointment and make decision later if you feel siginificant improvement over next 24  before potentially cancelling if you feel unnecessary.  Follow up 5-7 days or as needed  25 minutes spent with pt. 50% of time spent counseling on plan going forward.  Mackie Pai, PA-C

## 2020-01-29 ENCOUNTER — Other Ambulatory Visit (HOSPITAL_COMMUNITY): Payer: Self-pay | Admitting: Obstetrics & Gynecology

## 2020-01-29 DIAGNOSIS — Z1231 Encounter for screening mammogram for malignant neoplasm of breast: Secondary | ICD-10-CM

## 2020-02-21 ENCOUNTER — Other Ambulatory Visit: Payer: Self-pay

## 2020-02-21 ENCOUNTER — Ambulatory Visit (HOSPITAL_COMMUNITY)
Admission: RE | Admit: 2020-02-21 | Discharge: 2020-02-21 | Disposition: A | Discharge status: Home / Self Care | Payer: 59 | Source: Ambulatory Visit | Attending: Obstetrics & Gynecology | Admitting: Obstetrics & Gynecology

## 2020-02-21 DIAGNOSIS — Z1231 Encounter for screening mammogram for malignant neoplasm of breast: Secondary | ICD-10-CM | POA: Diagnosis not present

## 2020-06-05 ENCOUNTER — Other Ambulatory Visit: Payer: Self-pay | Admitting: Obstetrics & Gynecology

## 2020-07-04 ENCOUNTER — Other Ambulatory Visit: Payer: Self-pay | Admitting: Obstetrics & Gynecology

## 2021-02-16 ENCOUNTER — Other Ambulatory Visit (HOSPITAL_COMMUNITY): Payer: Self-pay | Admitting: Obstetrics & Gynecology

## 2021-02-16 DIAGNOSIS — Z1231 Encounter for screening mammogram for malignant neoplasm of breast: Secondary | ICD-10-CM

## 2021-02-25 ENCOUNTER — Ambulatory Visit (HOSPITAL_COMMUNITY)
Admission: RE | Admit: 2021-02-25 | Discharge: 2021-02-25 | Disposition: A | Discharge status: Home / Self Care | Payer: 59 | Source: Ambulatory Visit | Attending: Obstetrics & Gynecology | Admitting: Obstetrics & Gynecology

## 2021-02-25 DIAGNOSIS — Z1231 Encounter for screening mammogram for malignant neoplasm of breast: Secondary | ICD-10-CM | POA: Insufficient documentation

## 2021-03-15 ENCOUNTER — Ambulatory Visit: Payer: 59 | Admitting: Obstetrics & Gynecology

## 2021-03-18 ENCOUNTER — Ambulatory Visit (INDEPENDENT_AMBULATORY_CARE_PROVIDER_SITE_OTHER): Payer: 59 | Admitting: Obstetrics & Gynecology

## 2021-03-18 ENCOUNTER — Other Ambulatory Visit (HOSPITAL_COMMUNITY)
Admission: RE | Admit: 2021-03-18 | Discharge: 2021-03-18 | Disposition: A | Discharge status: Home / Self Care | Payer: 59 | Source: Ambulatory Visit | Attending: Obstetrics & Gynecology | Admitting: Obstetrics & Gynecology

## 2021-03-18 ENCOUNTER — Other Ambulatory Visit: Payer: Self-pay

## 2021-03-18 ENCOUNTER — Encounter: Payer: Self-pay | Admitting: Obstetrics & Gynecology

## 2021-03-18 VITALS — BP 139/83 | HR 58 | Ht 69.0 in | Wt 165.0 lb

## 2021-03-18 DIAGNOSIS — Z124 Encounter for screening for malignant neoplasm of cervix: Secondary | ICD-10-CM | POA: Insufficient documentation

## 2021-03-18 DIAGNOSIS — Z01419 Encounter for gynecological examination (general) (routine) without abnormal findings: Secondary | ICD-10-CM

## 2021-03-18 NOTE — Progress Notes (Signed)
Chief Complaint  Patient presents with   Pap only      45 y.o. G1P0101 Patient's last menstrual period was 03/04/2021 (approximate). The current method of family planning is OCP (estrogen/progesterone).  Outpatient Encounter Medications as of 03/18/2021  Medication Sig   escitalopram (LEXAPRO) 20 MG tablet TAKE 1 TABLET BY MOUTH EVERY DAY   JUNEL FE 1/20 1-20 MG-MCG tablet TAKE 1 TABLET BY MOUTH EVERY DAY   [DISCONTINUED] doxycycline (VIBRA-TABS) 100 MG tablet Take 1 tablet (100 mg total) by mouth 2 (two) times daily.   [DISCONTINUED] tobramycin (TOBREX) 0.3 % ophthalmic solution Place 2 drops into the left eye every 6 (six) hours.   No facility-administered encounter medications on file as of 03/18/2021.    Subjective Pt her for gyn exam No complaints, recent presence of small left perineal cyst, 5 o'clock Past Medical History:  Diagnosis Date   Anxiety and depression    Bartholin gland cyst    Recurrent (left)   Broken foot    Right foot   GAD (generalized anxiety disorder)    GERD (gastroesophageal reflux disease)    Headache disorder    Tension in neck, occiput, tense shoulders.  No migrainous features.  Takes motrin and/or tylenol and it helps usually.  MRI neck and brain in the past normal (2009)   High grade squamous intraepithelial cervical dysplasia 2016   Cervical conization: CIN 1.  Subsequent pap's NEG.   History of anemia    UTI (lower urinary tract infection)    Vulvar cyst    Excised 2012    Past Surgical History:  Procedure Laterality Date   CERVICAL CONIZATION W/BX N/A 08/19/2015   Path: CIN 1  Procedure: LASER CONIZATION OF THE CERVIX;  Surgeon: Florian Buff, MD;  Location: [facility];  Service: Gynecology;  Laterality: N/A;   CESAREAN SECTION     CYSTECTOMY  2012   Vulvar (Dr. Elonda Husky)   HOLMIUM LASER APPLICATION N/A 09/81/1914   Procedure: HOLMIUM LASER APPLICATION;  Surgeon: Florian Buff, MD;  Location: [facility];  Service: Gynecology;   Laterality: N/A;    OB History     Gravida  1   Para  1   Term      Preterm  1   AB      Living  1      SAB      IAB      Ectopic      Multiple      Live Births  1           No Known Allergies  Social History   Socioeconomic History   Marital status: Married    Spouse name: Not on file   Number of children: Not on file   Years of education: Not on file   Highest education level: Not on file  Occupational History   Not on file  Tobacco Use   Smoking status: Never   Smokeless tobacco: Never  Vaping Use   Vaping Use: Never used  Substance and Sexual Activity   Alcohol use: Yes    Comment: rarely   Drug use: No   Sexual activity: Yes    Birth control/protection: Pill  Other Topics Concern   Not on file  Social History Narrative   Divorced, remarried, has one teenage son.   She has an associates degree in nursing and works at [employer].   No T/A/Ds.   No significant exercise.   Normal american diet.  Social Determinants of Health   Financial Resource Strain: Low Risk    Difficulty of Paying Living Expenses: Not hard at all  Food Insecurity: No Food Insecurity   Worried About Charity fundraiser in the Last Year: Never true   University in the Last Year: Never true  Transportation Needs: No Transportation Needs   Lack of Transportation (Medical): No   Lack of Transportation (Non-Medical): No  Physical Activity: Insufficiently Active   Days of Exercise per Week: 1 day   Minutes of Exercise per Session: 20 min  Stress: Stress Concern Present   Feeling of Stress : To some extent  Social Connections: Engineer, building services of Communication with Friends and Family: More than three times a week   Frequency of Social Gatherings with Friends and Family: Twice a week   Attends Religious Services: More than 4 times per year   Active Member of [employer] or Organizations: Yes   Attends Music therapist: More than 4 times  per year   Marital Status: Married    Family History  Problem Relation Age of Onset   Hypertension Father    Transient ischemic attack Father    Stroke Father    Heart attack Father     Medications:       Current Outpatient Medications:    escitalopram (LEXAPRO) 20 MG tablet, TAKE 1 TABLET BY MOUTH EVERY DAY, Disp: 90 tablet, Rfl: 3   JUNEL FE 1/20 1-20 MG-MCG tablet, TAKE 1 TABLET BY MOUTH EVERY DAY, Disp: 84 tablet, Rfl: 3  Objective Blood pressure 139/83, pulse (!) 58, height 5\' 9"  (1.753 m), weight 165 lb (74.8 kg), last menstrual period 03/04/2021.  General WDWN female NAD Vulva:  normal appearing vulva (s/p left vulvectomy essentially)with no masses, tenderness or lesions Vagina:  normal mucosa, no discharge, small 1 cm cyst perineum, non tender mobile Cervix:  Normal no lesions Uterus:  normal size, contour, position, consistency, mobility, non-tender Adnexa: ovaries:present,  normal adnexa in size, nontender and no masses   Pertinent ROS No burning with urination, frequency or urgency No nausea, vomiting or diarrhea Nor fever chills or other constitutional symptoms   Labs or studies     Impression Diagnoses this Encounter::   ICD-10-CM   1. Well woman exam with routine gynecological exam  Z01.419     2. Routine cervical smear  Z12.4 Cytology - PAP( [place])      Established relevant diagnosis(es):   Plan/Recommendations: No orders of the defined types were placed in this encounter.   Labs or Scans Ordered: No orders of the defined types were placed in this encounter.   Management:: Continue yearly exams with hx of HSIL/laser conization  Follow up Return in about 1 year (around 03/18/2022) for yearly.       All questions were answered.

## 2021-03-19 LAB — CYTOLOGY - PAP
Comment: NEGATIVE
Diagnosis: NEGATIVE
High risk HPV: NEGATIVE

## 2021-05-18 ENCOUNTER — Other Ambulatory Visit: Payer: Self-pay

## 2021-05-19 ENCOUNTER — Encounter: Payer: Self-pay | Admitting: Family Medicine

## 2021-05-19 ENCOUNTER — Ambulatory Visit (INDEPENDENT_AMBULATORY_CARE_PROVIDER_SITE_OTHER): Payer: 59 | Admitting: Family Medicine

## 2021-05-19 VITALS — BP 107/67 | HR 58 | Temp 98.0°F | Resp 16 | Ht 69.5 in | Wt 162.6 lb

## 2021-05-19 DIAGNOSIS — Z1211 Encounter for screening for malignant neoplasm of colon: Secondary | ICD-10-CM

## 2021-05-19 DIAGNOSIS — Z0000 Encounter for general adult medical examination without abnormal findings: Secondary | ICD-10-CM | POA: Diagnosis not present

## 2021-05-19 DIAGNOSIS — Z23 Encounter for immunization: Secondary | ICD-10-CM

## 2021-05-19 LAB — TSH: TSH: 0.89 u[IU]/mL (ref 0.35–5.50)

## 2021-05-19 LAB — CBC WITH DIFFERENTIAL/PLATELET
Basophils Absolute: 0 10*3/uL (ref 0.0–0.1)
Basophils Relative: 0.6 % (ref 0.0–3.0)
Eosinophils Absolute: 0.1 10*3/uL (ref 0.0–0.7)
Eosinophils Relative: 0.8 % (ref 0.0–5.0)
HCT: 38.8 % (ref 36.0–46.0)
Hemoglobin: 12.7 g/dL (ref 12.0–15.0)
Lymphocytes Relative: 24.9 % (ref 12.0–46.0)
Lymphs Abs: 2.1 10*3/uL (ref 0.7–4.0)
MCHC: 32.6 g/dL (ref 30.0–36.0)
MCV: 91.4 fl (ref 78.0–100.0)
Monocytes Absolute: 0.4 10*3/uL (ref 0.1–1.0)
Monocytes Relative: 4.5 % (ref 3.0–12.0)
Neutro Abs: 5.9 10*3/uL (ref 1.4–7.7)
Neutrophils Relative %: 69.2 % (ref 43.0–77.0)
Platelets: 290 10*3/uL (ref 150.0–400.0)
RBC: 4.25 Mil/uL (ref 3.87–5.11)
RDW: 12.8 % (ref 11.5–15.5)
WBC: 8.5 10*3/uL (ref 4.0–10.5)

## 2021-05-19 LAB — COMPREHENSIVE METABOLIC PANEL
ALT: 9 U/L (ref 0–35)
AST: 15 U/L (ref 0–37)
Albumin: 4.2 g/dL (ref 3.5–5.2)
Alkaline Phosphatase: 35 U/L — ABNORMAL LOW (ref 39–117)
BUN: 12 mg/dL (ref 6–23)
CO2: 23 mEq/L (ref 19–32)
Calcium: 9.3 mg/dL (ref 8.4–10.5)
Chloride: 106 mEq/L (ref 96–112)
Creatinine, Ser: 0.62 mg/dL (ref 0.40–1.20)
GFR: 106.04 mL/min (ref >60.00)
Glucose, Bld: 78 mg/dL (ref 70–99)
Potassium: 4.2 mEq/L (ref 3.5–5.1)
Sodium: 138 mEq/L (ref 135–145)
Total Bilirubin: 0.5 mg/dL (ref 0.2–1.2)
Total Protein: 7 g/dL (ref 6.0–8.3)

## 2021-05-19 LAB — LIPID PANEL
Cholesterol: 187 mg/dL (ref 0–200)
HDL: 68.7 mg/dL (ref >39.00)
LDL Cholesterol: 99 mg/dL (ref 0–99)
NonHDL: 118.69
Total CHOL/HDL Ratio: 3
Triglycerides: 96 mg/dL (ref 0.0–149.0)
VLDL: 19.2 mg/dL (ref 0.0–40.0)

## 2021-05-19 NOTE — Progress Notes (Signed)
Office Note 05/19/2021  CC:  Chief Complaint  Patient presents with   Annual Exam    Pt is fasting    HPI:  Stacey Walton is a 44 y.o. White female who is here for annual health maintenance exam and f/u GAD.   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 No formal exercise but active.  Diet pretty good.   She saw Dr. Elonda Husky a couple months ago for GYN exam and all was stable. She has been on lexapro '20mg'$  long term for GAD and this has been getting managed/RF'd by Dr. Elonda Husky.   Past Medical History:  Diagnosis Date   Anxiety and depression    Bartholin gland cyst    Recurrent (left)   Broken foot    Right foot   GAD (generalized anxiety disorder)    GERD (gastroesophageal reflux disease)    Headache disorder    Tension in neck, occiput, tense shoulders.  No migrainous features.  Takes motrin and/or tylenol and it helps usually.  MRI neck and brain in the past normal (2009)   High grade squamous intraepithelial cervical dysplasia 2016   Cervical conization: CIN 1.  Subsequent pap's NEG.   History of anemia    UTI (lower urinary tract infection)    Vulvar cyst    Excised 2012    Past Surgical History:  Procedure Laterality Date   CERVICAL CONIZATION W/BX N/A 08/19/2015   Path: CIN 1  Procedure: LASER CONIZATION OF THE CERVIX;  Surgeon: Florian Buff, MD;  Location: [facility];  Service: Gynecology;  Laterality: N/A;   CESAREAN SECTION     CYSTECTOMY  2012   Vulvar (Dr. Elonda Husky)   HOLMIUM LASER APPLICATION N/A [id number]   Procedure: HOLMIUM LASER APPLICATION;  Surgeon: Florian Buff, MD;  Location: [facility];  Service: Gynecology;  Laterality: N/A;    Family History  Problem Relation Age of Onset   Hypertension Father    Transient ischemic attack Father    Stroke Father    Heart attack Father     Social History   Socioeconomic History   Marital status: Married    Spouse name: Not on file   Number of children: Not on file   Years of education: Not on file    Highest education level: Not on file  Occupational History   Not on file  Tobacco Use   Smoking status: Never   Smokeless tobacco: Never  Vaping Use   Vaping Use: Never used  Substance and Sexual Activity   Alcohol use: Yes    Comment: rarely   Drug use: No   Sexual activity: Yes    Birth control/protection: Pill  Other Topics Concern   Not on file  Social History Narrative   Divorced, remarried, has one teenage son.   She has an associates degree in nursing and works at [employer].   No T/A/Ds.   No significant exercise.   Normal american diet.         Social Determinants of Health   Financial Resource Strain: Low Risk    Difficulty of Paying Living Expenses: Not hard at all  Food Insecurity: No Food Insecurity   Worried About Charity fundraiser in the Last Year: Never true   [place] in the Last Year: Never true  Transportation Needs: No Transportation Needs   Lack of Transportation (Medical): No   Lack of Transportation (Non-Medical): No  Physical Activity: Insufficiently Active   Days of  Exercise per Week: 1 day   Minutes of Exercise per Session: 20 min  Stress: Stress Concern Present   Feeling of Stress : To some extent  Social Connections: Engineer, building services of Communication with Friends and Family: More than three times a week   Frequency of Social Gatherings with Friends and Family: Twice a week   Attends Religious Services: More than 4 times per year   Active Member of [employer] or Organizations: Yes   Attends Music therapist: More than 4 times per year   Marital Status: Married  Human resources officer Violence: Not At Risk   Fear of Current or Ex-Partner: No   Emotionally Abused: No   Physically Abused: No   Sexually Abused: No    Outpatient Medications Prior to Visit  Medication Sig Dispense Refill   escitalopram (LEXAPRO) 20 MG tablet TAKE 1 TABLET BY MOUTH EVERY DAY 90 tablet 3   JUNEL FE 1/20 1-20 MG-MCG tablet TAKE 1 TABLET BY  MOUTH EVERY DAY 84 tablet 3   No facility-administered medications prior to visit.    No Known Allergies  ROS Review of Systems  Constitutional:  Negative for appetite change, chills, fatigue and fever.  HENT:  Negative for congestion, dental problem, ear pain and sore throat.   Eyes:  Negative for discharge, redness and visual disturbance.  Respiratory:  Negative for cough, chest tightness, shortness of breath and wheezing.   Cardiovascular:  Negative for chest pain, palpitations and leg swelling.  Gastrointestinal:  Negative for abdominal pain, blood in stool, diarrhea, nausea and vomiting.  Genitourinary:  Negative for difficulty urinating, dysuria, flank pain, frequency, hematuria and urgency.  Musculoskeletal:  Negative for arthralgias, back pain, joint swelling, myalgias and neck stiffness.  Skin:  Negative for pallor and rash.  Neurological:  Negative for dizziness, speech difficulty, weakness and headaches.  Hematological:  Negative for adenopathy. Does not bruise/bleed easily.  Psychiatric/Behavioral:  Negative for confusion and sleep disturbance. The patient is not nervous/anxious.    PE; Vitals with BMI 05/19/2021 03/18/2021 08/07/2019  Height 5' 9.5" '5\' 9"'$  '5\' 8"'$   Weight 162 lbs 10 oz 165 lbs 181 lbs 6 oz  BMI 23.68 [id number]  Systolic [id number]  Diastolic 67 83 73  Pulse 58 58 64  Exam chaperoned by Kavin Leech, CMA.  Gen: Alert, well appearing.  Patient is oriented to person, place, time, and situation. AFFECT: pleasant, lucid thought and speech. ENT: Ears: EACs clear, normal epithelium.  TMs with good light reflex and landmarks bilaterally.  Eyes: no injection, icteris, swelling, or exudate.  EOMI, PERRLA. Nose: no drainage or turbinate edema/swelling.  No injection or focal lesion.  Mouth: lips without lesion/swelling.  Oral mucosa pink and moist.  Dentition intact and without obvious caries or gingival swelling.  Oropharynx without erythema, exudate, or  swelling.  Neck: supple/nontender.  No LAD, mass, or TM.  Carotid pulses 2+ bilaterally, without bruits. CV: RRR, no m/r/g.   LUNGS: CTA bilat, nonlabored resps, good aeration in all lung fields. ABD: soft, NT, ND, BS normal.  No hepatospenomegaly or mass.  No bruits. EXT: no clubbing, cyanosis, or edema.  Musculoskeletal: no joint swelling, erythema, warmth, or tenderness.  ROM of all joints intact. Skin - no sores or suspicious lesions or rashes or color changes  Pertinent labs:  Lab Results  Component Value Date   TSH 0.85 08/07/2014   Lab Results  Component Value Date   WBC 7.3 08/17/2015   HGB 12.8 08/17/2015  HCT 39.2 08/17/2015   MCV 89.9 08/17/2015   PLT 289 08/17/2015   Lab Results  Component Value Date   CREATININE 0.65 08/17/2015   BUN 10 08/17/2015   NA 138 08/17/2015   K 3.9 08/17/2015   CL 107 08/17/2015   CO2 22 08/17/2015   Lab Results  Component Value Date   ALT 13 (L) 08/17/2015   AST 23 08/17/2015   ALKPHOS 43 08/17/2015   BILITOT 0.6 08/17/2015   Lab Results  Component Value Date   CHOL 207 (H) 08/07/2014   Lab Results  Component Value Date   HDL 89.90 08/07/2014   Lab Results  Component Value Date   LDLCALC 107 (H) 08/07/2014   Lab Results  Component Value Date   TRIG 50.0 08/07/2014   Lab Results  Component Value Date   CHOLHDL 2 08/07/2014    ASSESSMENT AND PLAN:   Health maintenance exam: Reviewed age and gender appropriate health maintenance issues (prudent diet, regular exercise, health risks of tobacco and excessive alcohol, use of seatbelts, fire alarms in home, use of sunscreen).  Also reviewed age and gender appropriate health screening as well as vaccine recommendations. Vaccines: Flu-->given today.  Otherwise All UTD. Labs: fasting HP labs ordered. Cervical ca screening: per GYN MD, Dr. Elonda Husky, PAP NEG 02/2021. Breast ca screening: per GYN MD, Dr. Elonda Husky, UTD 02/2021->normal. Colon ca screening: average risk patient= as per  latest guidelines, start screening at any time now->GI referral ordered.  An After Visit Summary was printed and given to the patient.  FOLLOW UP:  Return in about 1 year (around 05/19/2022) for annual CPE (fasting).  Signed:  Crissie Sickles, MD           05/19/2021

## 2021-05-21 ENCOUNTER — Other Ambulatory Visit: Payer: Self-pay | Admitting: Obstetrics & Gynecology

## 2021-05-27 ENCOUNTER — Encounter (INDEPENDENT_AMBULATORY_CARE_PROVIDER_SITE_OTHER): Payer: Self-pay | Admitting: *Deleted

## 2021-10-19 ENCOUNTER — Encounter (INDEPENDENT_AMBULATORY_CARE_PROVIDER_SITE_OTHER): Payer: Self-pay

## 2021-10-19 ENCOUNTER — Telehealth (INDEPENDENT_AMBULATORY_CARE_PROVIDER_SITE_OTHER): Payer: Self-pay | Admitting: *Deleted

## 2021-10-19 ENCOUNTER — Encounter (INDEPENDENT_AMBULATORY_CARE_PROVIDER_SITE_OTHER): Payer: Self-pay | Admitting: *Deleted

## 2021-10-19 NOTE — Telephone Encounter (Signed)
Patient needs clenpiq

## 2021-10-20 ENCOUNTER — Other Ambulatory Visit (INDEPENDENT_AMBULATORY_CARE_PROVIDER_SITE_OTHER): Payer: Self-pay

## 2021-10-20 DIAGNOSIS — Z1211 Encounter for screening for malignant neoplasm of colon: Secondary | ICD-10-CM

## 2021-10-20 DIAGNOSIS — Z01812 Encounter for preprocedural laboratory examination: Secondary | ICD-10-CM

## 2021-10-20 MED ORDER — CLENPIQ 10-3.5-12 MG-GM -GM/160ML PO SOLN: 1.0000 | Freq: Once | ORAL | 0 refills | Status: AC

## 2021-10-26 ENCOUNTER — Encounter: Payer: Self-pay | Admitting: Obstetrics & Gynecology

## 2021-10-27 NOTE — Telephone Encounter (Signed)
Message Pam and let her know I will do it but will be in office next week.  If I message her directly now this info goes away, so message if you would.

## 2021-11-11 ENCOUNTER — Telehealth (INDEPENDENT_AMBULATORY_CARE_PROVIDER_SITE_OTHER): Payer: Self-pay | Admitting: *Deleted

## 2021-11-11 NOTE — Telephone Encounter (Signed)
Referring MD/PCP: [email or web address]  Procedure: tcs  Reason/Indication:  screening  Has patient had this procedure before?  no  If so, when, by whom and where?    Is there a family history of colon cancer?  no  Who?  What age when diagnosed?    Is patient diabetic? If yes, Type 1 or Type 2   no      Does patient have prosthetic heart valve or mechanical valve?  no  Do you have a pacemaker/defibrillator?  no  Has patient ever had endocarditis/atrial fibrillation? no  Does patient use oxygen? no  Has patient had joint replacement within last 12 months?  no  Is patient constipated or do they take laxatives? no  Does patient have a history of alcohol/drug use?  no  Have you had a stroke/heart attack last 6 mths? no  Do you take medicine for weight loss?  no  For female patients,: have you had a hysterectomy                       are you post menopausal                       do you still have your menstrual cycle yes  Is patient on blood thinner such as Coumadin, Plavix and/or Aspirin? no  Medications: lexapro 20 mg daily, junel Fe 1/20  Allergies: nkda  Medication Adjustment per Dr Rehman/Dr Jenetta Downer iron 10 days  Procedure date & time: 12/09/21

## 2021-12-03 ENCOUNTER — Encounter (INDEPENDENT_AMBULATORY_CARE_PROVIDER_SITE_OTHER): Payer: Self-pay

## 2021-12-07 ENCOUNTER — Other Ambulatory Visit (HOSPITAL_COMMUNITY)
Admission: AD | Admit: 2021-12-07 | Discharge: 2021-12-07 | Disposition: A | Discharge status: Home / Self Care | Payer: 59 | Source: Ambulatory Visit | Attending: Internal Medicine | Admitting: Internal Medicine

## 2021-12-07 ENCOUNTER — Other Ambulatory Visit (INDEPENDENT_AMBULATORY_CARE_PROVIDER_SITE_OTHER): Payer: Self-pay

## 2021-12-07 DIAGNOSIS — Z01812 Encounter for preprocedural laboratory examination: Secondary | ICD-10-CM | POA: Insufficient documentation

## 2021-12-07 LAB — PREGNANCY, URINE: Preg Test, Ur: NEGATIVE

## 2021-12-09 ENCOUNTER — Ambulatory Visit (HOSPITAL_COMMUNITY)
Admission: RE | Admit: 2021-12-09 | Discharge: 2021-12-09 | Disposition: A | Discharge status: Home / Self Care | Payer: 59 | Attending: Internal Medicine | Admitting: Internal Medicine

## 2021-12-09 ENCOUNTER — Encounter (INDEPENDENT_AMBULATORY_CARE_PROVIDER_SITE_OTHER): Payer: Self-pay | Admitting: *Deleted

## 2021-12-09 ENCOUNTER — Other Ambulatory Visit: Payer: Self-pay

## 2021-12-09 ENCOUNTER — Ambulatory Visit (HOSPITAL_COMMUNITY): Payer: 59 | Admitting: Anesthesiology

## 2021-12-09 ENCOUNTER — Encounter (HOSPITAL_COMMUNITY): Payer: Self-pay | Admitting: Internal Medicine

## 2021-12-09 ENCOUNTER — Ambulatory Visit (HOSPITAL_BASED_OUTPATIENT_CLINIC_OR_DEPARTMENT_OTHER): Payer: 59 | Admitting: Anesthesiology

## 2021-12-09 ENCOUNTER — Encounter (HOSPITAL_COMMUNITY)
Admission: RE | Disposition: A | Discharge status: Home / Self Care | Payer: Self-pay | Source: Home / Self Care | Attending: Internal Medicine

## 2021-12-09 DIAGNOSIS — D122 Benign neoplasm of ascending colon: Secondary | ICD-10-CM | POA: Diagnosis not present

## 2021-12-09 DIAGNOSIS — Z1211 Encounter for screening for malignant neoplasm of colon: Secondary | ICD-10-CM

## 2021-12-09 DIAGNOSIS — F418 Other specified anxiety disorders: Secondary | ICD-10-CM | POA: Diagnosis not present

## 2021-12-09 DIAGNOSIS — Z8371 Family history of colonic polyps: Secondary | ICD-10-CM | POA: Diagnosis not present

## 2021-12-09 DIAGNOSIS — K644 Residual hemorrhoidal skin tags: Secondary | ICD-10-CM | POA: Insufficient documentation

## 2021-12-09 DIAGNOSIS — K648 Other hemorrhoids: Secondary | ICD-10-CM | POA: Diagnosis not present

## 2021-12-09 DIAGNOSIS — K635 Polyp of colon: Secondary | ICD-10-CM

## 2021-12-09 HISTORY — PX: POLYPECTOMY: SHX149

## 2021-12-09 HISTORY — PX: COLONOSCOPY WITH PROPOFOL: SHX5780

## 2021-12-09 LAB — HM COLONOSCOPY

## 2021-12-09 SURGERY — COLONOSCOPY WITH PROPOFOL
Anesthesia: General

## 2021-12-09 MED ORDER — PROPOFOL 500 MG/50ML IV EMUL
INTRAVENOUS | Status: DC | PRN
  Administered 2021-12-09: 150 ug/kg/min via INTRAVENOUS

## 2021-12-09 MED ORDER — PROPOFOL 10 MG/ML IV BOLUS
INTRAVENOUS | Status: DC | PRN
Start: 2021-12-09 — End: 2021-12-09
  Administered 2021-12-09: 20 mg via INTRAVENOUS
  Administered 2021-12-09: 80 mg via INTRAVENOUS

## 2021-12-09 MED ORDER — PROPOFOL 1000 MG/100ML IV EMUL
INTRAVENOUS | Status: AC
  Filled 2021-12-09: qty 200

## 2021-12-09 MED ORDER — LACTATED RINGERS IV SOLN: INTRAVENOUS | Status: DC

## 2021-12-09 MED ORDER — LIDOCAINE HCL 1 % IJ SOLN
INTRAMUSCULAR | Status: DC | PRN
  Administered 2021-12-09: 50 mg via INTRADERMAL

## 2021-12-09 NOTE — Anesthesia Postprocedure Evaluation (Signed)
Anesthesia Post Note ? ?Patient: Stacey Walton ? ?Procedure(s) Performed: COLONOSCOPY WITH PROPOFOL ?POLYPECTOMY INTESTINAL ? ?Patient location during evaluation: Phase II ?Anesthesia Type: General ?Level of consciousness: awake and alert and oriented ?Pain management: pain level controlled ?Vital Signs Assessment: post-procedure vital signs reviewed and stable ?Respiratory status: spontaneous breathing, nonlabored ventilation and respiratory function stable ?Cardiovascular status: blood pressure returned to baseline and stable ?Postop Assessment: no apparent nausea or vomiting ?Anesthetic complications: no ? ? ?No notable events documented. ? ? ?Last Vitals:  ?Vitals:  ? 12/09/21 0725 12/09/21 0853  ?BP: 137/73 99/60  ?Pulse: (!) 54 (!) 57  ?Resp: 16 16  ?Temp: 36.7 ?C 36.6 ?C  ?SpO2: 98% 99%  ?  ?Last Pain:  ?Vitals:  ? 12/09/21 0853  ?TempSrc: Axillary  ?PainSc: 0-No pain  ? ? ?  ?  ?  ?  ?  ?  ? ?Madeleine Fenn C Shoshana Johal ? ? ? ? ?

## 2021-12-09 NOTE — H&P (Signed)
Stacey Walton is an 46 y.o. female.   ?Chief Complaint: Patient is here for colonoscopy ?HPI: Patient is 46 year old Caucasian female who is here for screening colonoscopy.  She denies abdominal pain change in bowel habits or rectal bleeding. ?Family history is negative for colon cancer but her father has had few polyps removed from his colon. ?Patient does not take aspirin or anticoagulants. ? ?Past Medical History:  ?Diagnosis Date  ? Anxiety and depression   ? Bartholin gland cyst   ? Recurrent (left)  ? Broken foot   ? Right foot  ? GAD (generalized anxiety disorder)   ? GERD (gastroesophageal reflux disease)   ? Headache disorder   ? Tension in neck, occiput, tense shoulders.  No migrainous features.  Takes motrin and/or tylenol and it helps usually.  MRI neck and brain in the past normal (2009)  ? High grade squamous intraepithelial cervical dysplasia 2016  ? Cervical conization: CIN 1.  Subsequent pap's NEG.  ? History of anemia   ? UTI (lower urinary tract infection)   ? Vulvar cyst   ? Excised 2012  ? ? ?Past Surgical History:  ?Procedure Laterality Date  ? CERVICAL CONIZATION W/BX N/A 08/19/2015  ? Path: CIN 1  Procedure: LASER CONIZATION OF THE CERVIX;  Surgeon: Florian Buff, MD;  Location: [facility];  Service: Gynecology;  Laterality: N/A;  ? CESAREAN SECTION    ? CYSTECTOMY  2012  ? Vulvar (Dr. Elonda Husky)  ? HOLMIUM LASER APPLICATION N/A 68/11/2120  ? Procedure: HOLMIUM LASER APPLICATION;  Surgeon: Florian Buff, MD;  Location: [facility];  Service: Gynecology;  Laterality: N/A;  ? ? ?Family History  ?Problem Relation Age of Onset  ? Hypertension Father   ? Transient ischemic attack Father   ? Stroke Father   ? Heart attack Father   ? ?Social History:  reports that she has never smoked. She has never used smokeless tobacco. She reports current alcohol use. She reports that she does not use drugs. ? ?Allergies: No Known Allergies ? ?Medications Prior to Admission  ?Medication Sig Dispense Refill  ? JUNEL FE 1/20  1-20 MG-MCG tablet TAKE 1 TABLET BY MOUTH EVERY DAY 84 tablet 3  ? Multiple Vitamin (MULTIVITAMIN WITH MINERALS) TABS tablet Take 1 tablet by mouth at bedtime.    ? ? ?Results for orders placed or performed during the hospital encounter of 12/07/21 (from the past 48 hour(s))  ?Pregnancy, urine     Status: None  ? Collection Time: 12/07/21 10:30 AM  ?Result Value Ref Range  ? Preg Test, Ur NEGATIVE NEGATIVE  ?  Comment:        ?THE SENSITIVITY OF THIS ?METHODOLOGY IS >20 mIU/mL. ?Performed at [facility], [employer] [address], [place], [place] [address] ?  ? ?No results found. ? ?Review of Systems ? ?Blood pressure 137/73, pulse (!) 54, temperature 98 ?F (36.7 ?C), temperature source Oral, resp. rate 16, height '5\' 9"'$  (1.753 m), weight 78.5 kg, SpO2 98 %. ?Physical Exam ?HENT:  ?   Mouth/Throat:  ?   Mouth: Mucous membranes are moist.  ?   Pharynx: Oropharynx is clear.  ?Eyes:  ?   General: No scleral icterus. ?   Conjunctiva/sclera: Conjunctivae normal.  ?Cardiovascular:  ?   Rate and Rhythm: Normal rate and regular rhythm.  ?   Heart sounds: Normal heart sounds. No murmur heard. ?Pulmonary:  ?   Effort: Pulmonary effort is normal.  ?   Breath sounds: Normal breath  sounds.  ?Abdominal:  ?   General: There is no distension.  ?   Palpations: Abdomen is soft. There is no mass.  ?   Tenderness: There is no abdominal tenderness.  ?   Comments: Pfannenstiel scar  ?Musculoskeletal:     ?   General: No swelling.  ?   Cervical back: Neck supple.  ?Lymphadenopathy:  ?   Cervical: No cervical adenopathy.  ?Skin: ?   General: Skin is warm and dry.  ?Neurological:  ?   Mental Status: She is alert.  ?  ? ?Assessment/Plan ? ?Average risk screening colonoscopy ? ?Hildred Laser, MD ?12/09/2021, 8:17 AM ? ? ? ?

## 2021-12-09 NOTE — Op Note (Signed)
[facility] ?Patient Name: Stacey Walton ?Procedure Date: 12/09/2021 8:01 AM ?MRN: [id number] ?Date of Birth: [birth year] ?Attending MD: Hildred Laser , MD ?CSN: [id number] ?Age: 46 ?Admit Type: Outpatient ?Procedure:                Colonoscopy ?Indications:              Screening for colorectal malignant neoplasm ?Providers:                Hildred Laser, MD, Launiupoko Page, Aram Candela ?Referring MD:             Tammi Sou, MD ?Medicines:                Propofol per Anesthesia ?Complications:            No immediate complications. ?Estimated Blood Loss:     Estimated blood loss was minimal. Estimated blood  ?                          loss was minimal. ?Procedure:                Pre-Anesthesia Assessment: ?                          - Prior to the procedure, a History and Physical  ?                          was performed, and patient medications and  ?                          allergies were reviewed. The patient's tolerance of  ?                          previous anesthesia was also reviewed. The risks  ?                          and benefits of the procedure and the sedation  ?                          options and risks were discussed with the patient.  ?                          All questions were answered, and informed consent  ?                          was obtained. Prior Anticoagulants: The patient has  ?                          taken no previous anticoagulant or antiplatelet  ?                          agents. ASA Grade Assessment: I - A normal, healthy  ?                          patient. After reviewing the risks and benefits,  ?  the patient was deemed in satisfactory condition to  ?                          undergo the procedure. ?                          After obtaining informed consent, the colonoscope  ?                          was passed under direct vision. Throughout the  ?                          procedure, the patient's blood pressure, pulse, and  ?                           oxygen saturations were monitored continuously. The  ?                          PCF-HQ190L ([id number]) scope was introduced through  ?                          the anus and advanced to the the cecum, identified  ?                          by appendiceal orifice and ileocecal valve. The  ?                          colonoscopy was performed without difficulty. The  ?                          patient tolerated the procedure well. The quality  ?                          of the bowel preparation was good. The ileocecal  ?                          valve, appendiceal orifice, and rectum were  ?                          photographed. ?Scope In: 8:29:03 AM ?Scope Out: 8:50:08 AM ?Scope Withdrawal Time: 0 hours 5 minutes 43 seconds  ?Total Procedure Duration: 0 hours 21 minutes 5 seconds  ?Findings: ?     The perianal and digital rectal examinations were normal. ?     Two polyps were found in the ascending colon. The polyps were small in  ?     size. These were biopsied with a cold forceps for histology. The  ?     pathology specimen was placed into Bottle Number 1. ?     A small polyp was found in the hepatic flexure. The polyp was removed  ?     with a cold snare. Resection and retrieval were complete. The pathology  ?     specimen was placed into Bottle Number 1. ?     External hemorrhoids were found during retroflexion. The hemorrhoids  ?     were small. ?Impression:               -  Two small polyps in the ascending colon. Biopsied. ?                          - One small polyp at the hepatic flexure, removed  ?                          with a cold snare. Resected and retrieved. ?Moderate Sedation: ?     Per Anesthesia Care ?Recommendation:           - Patient has a contact number available for  ?                          emergencies. The signs and symptoms of potential  ?                          delayed complications were discussed with the  ?                          patient. Return to normal activities  tomorrow.  ?                          Written discharge instructions were provided to the  ?                          patient. ?                          - Resume previous diet today. ?                          - Continue present medications. ?                          - No aspirin, ibuprofen, naproxen, or other  ?                          non-steroidal anti-inflammatory drugs for 1 day. ?                          - Await pathology results. ?                          - Repeat colonoscopy is recommended. The  ?                          colonoscopy date will be determined after pathology  ?                          results from today's exam become available for  ?                          review. ?Procedure Code(s):        --- Professional --- ?                          [id number], Colonoscopy, flexible; with removal of  ?  tumor(s), polyp(s), or other lesion(s) by snare  ?                          technique ?                          45380, 59, Colonoscopy, flexible; with biopsy,  ?                          single or multiple ?Diagnosis Code(s):        --- Professional --- ?                          K63.5, Polyp of colon ?                          Z12.11, Encounter for screening for malignant  ?                          neoplasm of colon ?CPT copyright 2019 [employer] Association. All rights reserved. ?The codes documented in this report are preliminary and upon coder review may  ?be revised to meet current compliance requirements. ?Hildred Laser, MD ?Hildred Laser, MD ?12/09/2021 8:58:23 AM ?This report has been signed electronically. ?Number of Addenda: 0 ?

## 2021-12-09 NOTE — Discharge Instructions (Signed)
No aspirin or NSAIDs for 24 hours Resume usual medications and diet as before. No driving for 24 hours. Physician will call with biopsy results. 

## 2021-12-09 NOTE — Anesthesia Preprocedure Evaluation (Signed)
Anesthesia Evaluation  ?Patient identified by MRN, date of birth, ID band ?Patient awake ? ? ? ?Reviewed: ?Allergy & Precautions, NPO status , Patient's Chart, lab work & pertinent test results ? ?Airway ?Mallampati: II ? ?TM Distance: >3 FB ?Neck ROM: Full ? ? ? Dental ? ?(+) Dental Advisory Given, Teeth Intact ?  ?Pulmonary ?neg pulmonary ROS,  ?  ?Pulmonary exam normal ?breath sounds clear to auscultation ? ? ? ? ? ? Cardiovascular ?negative cardio ROS ?Normal cardiovascular exam ?Rhythm:Regular Rate:Normal ? ? ?  ?Neuro/Psych ? Headaches, PSYCHIATRIC DISORDERS Anxiety Depression   ? GI/Hepatic ?Neg liver ROS, GERD  ,  ?Endo/Other  ?negative endocrine ROS ? Renal/GU ?negative Renal ROS  ?negative genitourinary ?  ?Musculoskeletal ?negative musculoskeletal ROS ?(+)  ? Abdominal ?  ?Peds ?negative pediatric ROS ?(+)  Hematology ?negative hematology ROS ?(+)   ?Anesthesia Other Findings ? ? Reproductive/Obstetrics ?negative OB ROS ? ?  ? ? ? ? ? ? ? ? ? ? ? ? ? ?  ?  ? ? ? ? ? ? ? ?Anesthesia Physical ?Anesthesia Plan ? ?ASA: 1 ? ?Anesthesia Plan: General  ? ?Post-op Pain Management: Minimal or no pain anticipated  ? ?Induction: Intravenous ? ?PONV Risk Score and Plan: TIVA ? ?Airway Management Planned: Nasal Cannula and Natural Airway ? ?Additional Equipment:  ? ?Intra-op Plan:  ? ?Post-operative Plan:  ? ?Informed Consent: I have reviewed the patients History and Physical, chart, labs and discussed the procedure including the risks, benefits and alternatives for the proposed anesthesia with the patient or authorized representative who has indicated his/her understanding and acceptance.  ? ? ? ?Dental advisory given ? ?Plan Discussed with: CRNA and Surgeon ? ?Anesthesia Plan Comments:   ? ? ? ? ? ?Anesthesia Quick Evaluation ? ?

## 2021-12-09 NOTE — Transfer of Care (Signed)
Immediate Anesthesia Transfer of Care Note ? ?Patient: Stacey Walton ? ?Procedure(s) Performed: COLONOSCOPY WITH PROPOFOL ?POLYPECTOMY INTESTINAL ? ?Patient Location: Endoscopy Unit ? ?Anesthesia Type:General ? ?Level of Consciousness: awake ? ?Airway & Oxygen Therapy: Patient Spontanous Breathing ? ?Post-op Assessment: Report given to RN and Post -op Vital signs reviewed and stable ? ?Post vital signs: Reviewed and stable ? ?Last Vitals:  ?Vitals Value Taken Time  ?BP    ?Temp    ?Pulse    ?Resp    ?SpO2    ? ? ?Last Pain:  ?Vitals:  ? 12/09/21 0824  ?TempSrc:   ?PainSc: 0-No pain  ?   ? ?Patients Stated Pain Goal: 7 (12/09/21 0716) ? ?Complications: No notable events documented. ?

## 2021-12-10 HISTORY — PX: COLONOSCOPY: SHX174

## 2021-12-10 LAB — SURGICAL PATHOLOGY

## 2021-12-13 ENCOUNTER — Encounter: Payer: Self-pay | Admitting: Family Medicine

## 2021-12-14 ENCOUNTER — Encounter (HOSPITAL_COMMUNITY): Payer: Self-pay | Admitting: Internal Medicine

## 2022-01-14 ENCOUNTER — Ambulatory Visit (INDEPENDENT_AMBULATORY_CARE_PROVIDER_SITE_OTHER): Payer: 59 | Admitting: Family Medicine

## 2022-01-14 ENCOUNTER — Encounter: Payer: Self-pay | Admitting: Family Medicine

## 2022-01-14 VITALS — BP 124/82 | HR 66 | Temp 98.1°F | Ht 69.0 in | Wt 181.8 lb

## 2022-01-14 DIAGNOSIS — R21 Rash and other nonspecific skin eruption: Secondary | ICD-10-CM | POA: Diagnosis not present

## 2022-01-14 DIAGNOSIS — L739 Follicular disorder, unspecified: Secondary | ICD-10-CM | POA: Diagnosis not present

## 2022-01-14 MED ORDER — CEPHALEXIN 500 MG PO CAPS
ORAL_CAPSULE | ORAL | 0 refills | Status: DC
Start: 2022-01-14 — End: 2022-05-24

## 2022-01-14 MED ORDER — MUPIROCIN 2 % EX OINT: 1.0000 "application " | TOPICAL_OINTMENT | Freq: Three times a day (TID) | CUTANEOUS | 0 refills | Status: DC

## 2022-01-14 NOTE — Progress Notes (Signed)
OFFICE VISIT ? ?01/14/2022 ? ?CC:  ?Chief Complaint  ?Patient presents with  ? Rash  ?  3 days ago; located on torso and thighs. Has used acne cream, neosporin, and hydrocortisone cream. Has gotten worse since using creams. Developed bumps under L and R arm, and torso as well.  ? ?Patient is a 46 y.o. female who presents for rash. ? ?HPI: ?Noted to red bump/pimple-type lesion in left armpit about a week ago.  She then went to a tanning bed in preparation for going to the beach soon--afterwards noted the number of spots on the left arm had increased.  Also has developed 1 in the right armpit. ?They are little sensitive, not draining. ?She then went to the tanning bed again--not thinking there was any link between the spots under her arms and the tanning bed. ?After that she started to get small pinkish bumps scattered sparsely over her legs and trunk, the spots are itchy.  She says this particular type of rash is something that she has had before after going to the tanning bed. ? ?No fever or systemic symptoms. ? ?She got a hot tub at the beginning of this year but husband is fastidious with this and really no one else uses it besides she and her husband. ? ?Past Medical History:  ?Diagnosis Date  ? Anxiety and depression   ? Bartholin gland cyst   ? Recurrent (left)  ? Broken foot   ? Right foot  ? GAD (generalized anxiety disorder)   ? GERD (gastroesophageal reflux disease)   ? Headache disorder   ? Tension in neck, occiput, tense shoulders.  No migrainous features.  Takes motrin and/or tylenol and it helps usually.  MRI neck and brain in the past normal (2009)  ? High grade squamous intraepithelial cervical dysplasia 2016  ? Cervical conization: CIN 1.  Subsequent pap's NEG.  ? History of anemia   ? UTI (lower urinary tract infection)   ? Vulvar cyst   ? Excised 2012  ? ? ?Past Surgical History:  ?Procedure Laterality Date  ? CERVICAL CONIZATION W/BX N/A 08/19/2015  ? Path: CIN 1  Procedure: LASER CONIZATION OF THE  CERVIX;  Surgeon: Florian Buff, MD;  Location: [facility];  Service: Gynecology;  Laterality: N/A;  ? CESAREAN SECTION    ? COLONOSCOPY  12/10/2021  ? 2023 polyp x1, recall 5 yrs  ? COLONOSCOPY WITH PROPOFOL N/A 12/09/2021  ? Procedure: COLONOSCOPY WITH PROPOFOL;  Surgeon: Rogene Houston, MD;  Location: [facility];  Service: Endoscopy;  Laterality: N/A;  1050  ? CYSTECTOMY  09/19/2010  ? Vulvar (Dr. Elonda Husky)  ? HOLMIUM LASER APPLICATION N/A 02/40/9735  ? Procedure: HOLMIUM LASER APPLICATION;  Surgeon: Florian Buff, MD;  Location: [facility];  Service: Gynecology;  Laterality: N/A;  ? POLYPECTOMY  12/09/2021  ? Procedure: POLYPECTOMY INTESTINAL;  Surgeon: Rogene Houston, MD;  Location: [facility];  Service: Endoscopy;;  ? ? ?Outpatient Medications Prior to Visit  ?Medication Sig Dispense Refill  ? JUNEL FE 1/20 1-20 MG-MCG tablet TAKE 1 TABLET BY MOUTH EVERY DAY 84 tablet 3  ? Multiple Vitamin (MULTIVITAMIN WITH MINERALS) TABS tablet Take 1 tablet by mouth at bedtime.    ? ?No facility-administered medications prior to visit.  ? ? ?No Known Allergies ? ?ROS ?As per HPI ? ?PE: ? ?  01/14/2022  ? 10:41 AM 12/09/2021  ?  8:53 AM 12/09/2021  ?  7:25 AM  ?Vitals with BMI  ?Height  $'5\' 9"'X$     ?Weight 181 lbs 13 oz    ?BMI 26.83    ?Systolic 676 99 195  ?Diastolic 82 60 73  ?Pulse 66 57 54  ? ? ?Physical Exam ? ?Exam chaperoned by Shepard General, CMA ? ?Gen: Alert, well appearing.  Patient is oriented to person, place, time, and situation. ?AFFECT: pleasant, lucid thought and speech. ?Skin: Left armpit with 6-8 erythematous papular lesions that have no subcutaneous swelling or palpable nodule.  No drainage.  Mildly sensitive to touch.  No surrounding erythema to suggest cellulitis. ?Right armpit with an isolated lesion similar to the ones on the left armpit ?Legs and trunk have very sparsely dispersed small pinkish papules. ?Skin is without vesicles, hives, or petechiae or pustules. ? ?LABS:  ?Last CBC ?Lab Results  ?Component  Value Date  ? WBC 8.5 05/19/2021  ? HGB 12.7 05/19/2021  ? HCT 38.8 05/19/2021  ? MCV 91.4 05/19/2021  ? MCH 29.4 08/17/2015  ? RDW 12.8 05/19/2021  ? PLT 290.0 05/19/2021  ? ?Last metabolic panel ?Lab Results  ?Component Value Date  ? GLUCOSE 78 05/19/2021  ? NA 138 05/19/2021  ? K 4.2 05/19/2021  ? CL 106 05/19/2021  ? CO2 23 05/19/2021  ? BUN 12 05/19/2021  ? CREATININE 0.62 05/19/2021  ? GFRNONAA >60 08/17/2015  ? CALCIUM 9.3 05/19/2021  ? PROT 7.0 05/19/2021  ? ALBUMIN 4.2 05/19/2021  ? BILITOT 0.5 05/19/2021  ? ALKPHOS 35 (L) 05/19/2021  ? AST 15 05/19/2021  ? ALT 9 05/19/2021  ? ANIONGAP 9 08/17/2015  ? ?IMPRESSION AND PLAN: ? ?It seems she has 2 unrelated kidney issues. ? ?#1 folliculitis of both axillae. ?We will treat with Keflex 500 mg 3 times daily x7 days and Bactroban ointment. ? ?#2 papular rash--suspect reaction to UV light/tanning bed. ? ?Reassured.  She will let me know how it goes and if early next week she is not seeing any response then will either reevaluate her or possibly switch antibiotics to cover for MRSA. ? ?An After Visit Summary was printed and given to the patient. ? ?FOLLOW UP: Return in about 4 months (around 05/16/2022) for annual CPE (fasting. ? ?Signed:  Crissie Sickles, MD           01/14/2022 ? ?

## 2022-02-02 ENCOUNTER — Other Ambulatory Visit (HOSPITAL_COMMUNITY): Payer: Self-pay | Admitting: Obstetrics & Gynecology

## 2022-02-02 DIAGNOSIS — Z1231 Encounter for screening mammogram for malignant neoplasm of breast: Secondary | ICD-10-CM

## 2022-03-02 ENCOUNTER — Ambulatory Visit (HOSPITAL_COMMUNITY)
Admission: RE | Admit: 2022-03-02 | Discharge: 2022-03-02 | Disposition: A | Discharge status: Home / Self Care | Payer: 59 | Source: Ambulatory Visit | Attending: Obstetrics & Gynecology | Admitting: Obstetrics & Gynecology

## 2022-03-02 DIAGNOSIS — Z1231 Encounter for screening mammogram for malignant neoplasm of breast: Secondary | ICD-10-CM | POA: Diagnosis present

## 2022-04-19 ENCOUNTER — Other Ambulatory Visit: Payer: Self-pay | Admitting: Obstetrics & Gynecology

## 2022-05-20 ENCOUNTER — Encounter: Payer: 59 | Admitting: Family Medicine

## 2022-05-25 ENCOUNTER — Ambulatory Visit (INDEPENDENT_AMBULATORY_CARE_PROVIDER_SITE_OTHER): Payer: 59 | Admitting: Family Medicine

## 2022-05-25 ENCOUNTER — Encounter: Payer: Self-pay | Admitting: Family Medicine

## 2022-05-25 VITALS — BP 130/80 | HR 60 | Temp 98.2°F | Ht 69.5 in | Wt 188.0 lb

## 2022-05-25 DIAGNOSIS — Z0000 Encounter for general adult medical examination without abnormal findings: Secondary | ICD-10-CM | POA: Diagnosis not present

## 2022-05-25 LAB — COMPREHENSIVE METABOLIC PANEL
ALT: 10 U/L (ref 0–35)
AST: 15 U/L (ref 0–37)
Albumin: 3.9 g/dL (ref 3.5–5.2)
Alkaline Phosphatase: 50 U/L (ref 39–117)
BUN: 12 mg/dL (ref 6–23)
CO2: 24 mEq/L (ref 19–32)
Calcium: 9 mg/dL (ref 8.4–10.5)
Chloride: 105 mEq/L (ref 96–112)
Creatinine, Ser: 0.67 mg/dL (ref 0.40–1.20)
GFR: 103.34 mL/min (ref >60.00)
Glucose, Bld: 74 mg/dL (ref 70–99)
Potassium: 4.4 mEq/L (ref 3.5–5.1)
Sodium: 138 mEq/L (ref 135–145)
Total Bilirubin: 0.6 mg/dL (ref 0.2–1.2)
Total Protein: 6.8 g/dL (ref 6.0–8.3)

## 2022-05-25 LAB — LIPID PANEL
Cholesterol: 213 mg/dL — ABNORMAL HIGH (ref 0–200)
HDL: 87.9 mg/dL (ref >39.00)
LDL Cholesterol: 110 mg/dL — ABNORMAL HIGH (ref 0–99)
NonHDL: 124.6
Total CHOL/HDL Ratio: 2
Triglycerides: 72 mg/dL (ref 0.0–149.0)
VLDL: 14.4 mg/dL (ref 0.0–40.0)

## 2022-05-25 LAB — CBC
HCT: 39.1 % (ref 36.0–46.0)
Hemoglobin: 12.9 g/dL (ref 12.0–15.0)
MCHC: 33.1 g/dL (ref 30.0–36.0)
MCV: 90.6 fl (ref 78.0–100.0)
Platelets: 312 10*3/uL (ref 150.0–400.0)
RBC: 4.31 Mil/uL (ref 3.87–5.11)
RDW: 13.5 % (ref 11.5–15.5)
WBC: 9.1 10*3/uL (ref 4.0–10.5)

## 2022-05-25 LAB — TSH: TSH: 0.91 u[IU]/mL (ref 0.35–5.50)

## 2022-05-25 NOTE — Patient Instructions (Signed)

## 2022-05-25 NOTE — Progress Notes (Signed)
Office Note 05/25/2022  CC:  Chief Complaint  Patient presents with   Annual Exam    Pt is fasting    HPI:  Patient is a 46 y.o. female who is here for annual health maintenance exam. Pam feels well. 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  She is on Lexapro 10 to 20 mg a day, prescribed by her GYN provider.  Past Medical History:  Diagnosis Date   Anxiety and depression    Bartholin gland cyst    Recurrent (left)   Broken foot    Right foot   GAD (generalized anxiety disorder)    GERD (gastroesophageal reflux disease)    Headache disorder    Tension in neck, occiput, tense shoulders.  No migrainous features.  Takes motrin and/or tylenol and it helps usually.  MRI neck and brain in the past normal (2009)   High grade squamous intraepithelial cervical dysplasia 2016   Cervical conization: CIN 1.  Subsequent pap's NEG.   History of anemia    UTI (lower urinary tract infection)    Vulvar cyst    Excised 2012    Past Surgical History:  Procedure Laterality Date   CERVICAL CONIZATION W/BX N/A 08/19/2015   Path: CIN 1  Procedure: LASER CONIZATION OF THE CERVIX;  Surgeon: Florian Buff, MD;  Location: [facility];  Service: Gynecology;  Laterality: N/A;   CESAREAN SECTION     COLONOSCOPY  12/10/2021   2023 polyp x1, recall 5 yrs   COLONOSCOPY WITH PROPOFOL N/A 12/09/2021   Procedure: COLONOSCOPY WITH PROPOFOL;  Surgeon: Rogene Houston, MD;  Location: [facility];  Service: Endoscopy;  Laterality: N/A;  1050   CYSTECTOMY  09/19/2010   Vulvar (Dr. Elonda Husky)   HOLMIUM LASER APPLICATION N/A 62/69/4854   Procedure: HOLMIUM LASER APPLICATION;  Surgeon: Florian Buff, MD;  Location: [facility];  Service: Gynecology;  Laterality: N/A;   POLYPECTOMY  12/09/2021   Procedure: POLYPECTOMY INTESTINAL;  Surgeon: Rogene Houston, MD;  Location: [facility];  Service: Endoscopy;;    Family History  Problem Relation Age of Onset   Hypertension Father    Transient ischemic attack Father    Stroke  Father    Heart attack Father     Social History   Socioeconomic History   Marital status: Married    Spouse name: Not on file   Number of children: Not on file   Years of education: Not on file   Highest education level: Not on file  Occupational History   Not on file  Tobacco Use   Smoking status: Never   Smokeless tobacco: Never  Vaping Use   Vaping Use: Never used  Substance and Sexual Activity   Alcohol use: Yes    Comment: rarely   Drug use: No   Sexual activity: Yes    Birth control/protection: Pill  Other Topics Concern   Not on file  Social History Narrative   Divorced, remarried, has one teenage son.   She has an associates degree in nursing and works at [employer].   No T/A/Ds.   No significant exercise.   Normal american diet.         Social Determinants of Health   Financial Resource Strain: Low Risk  (03/18/2021)   Overall Financial Resource Strain (CARDIA)    Difficulty of Paying Living Expenses: Not hard at all  Food Insecurity: No Food Insecurity (03/18/2021)   Hunger Vital Sign    Worried About Running Out of  Food in the Last Year: Never true    [place] in the Last Year: Never true  Transportation Needs: No Transportation Needs (03/18/2021)   PRAPARE - Hydrologist (Medical): No    Lack of Transportation (Non-Medical): No  Physical Activity: Insufficiently Active (03/18/2021)   Exercise Vital Sign    Days of Exercise per Week: 1 day    Minutes of Exercise per Session: 20 min  Stress: Stress Concern Present (03/18/2021)   [place]    Feeling of Stress : To some extent  Social Connections: Socially Integrated (03/18/2021)   Social Connection and Isolation Panel [NHANES]    Frequency of Communication with Friends and Family: More than three times a week    Frequency of Social Gatherings with Friends and Family: Twice a week    Attends Religious  Services: More than 4 times per year    Active Member of [employer] or Organizations: Yes    Attends Music therapist: More than 4 times per year    Marital Status: Married  Human resources officer Violence: Not At Risk (03/18/2021)   Humiliation, Afraid, Rape, and Kick questionnaire    Fear of Current or Ex-Partner: No    Emotionally Abused: No    Physically Abused: No    Sexually Abused: No    Outpatient Medications Prior to Visit  Medication Sig Dispense Refill   escitalopram (LEXAPRO) 20 MG tablet Take 20 mg by mouth daily.     JUNEL FE 1/20 1-20 MG-MCG tablet TAKE 1 TABLET BY MOUTH EVERY DAY 84 tablet 3   Multiple Vitamin (MULTIVITAMIN WITH MINERALS) TABS tablet Take 1 tablet by mouth at bedtime.     mupirocin ointment (BACTROBAN) 2 % Apply 1 application. topically 3 (three) times daily. 15 g 0   cephALEXin (KEFLEX) 500 MG capsule 1 tab po tid x 7d 21 capsule 0   No facility-administered medications prior to visit.    No Known Allergies  ROS Review of Systems  Constitutional:  Negative for appetite change, chills, fatigue and fever.  HENT:  Negative for congestion, dental problem, ear pain and sore throat.   Eyes:  Negative for discharge, redness and visual disturbance.  Respiratory:  Negative for cough, chest tightness, shortness of breath and wheezing.   Cardiovascular:  Negative for chest pain, palpitations and leg swelling.  Gastrointestinal:  Negative for abdominal pain, blood in stool, diarrhea, nausea and vomiting.  Genitourinary:  Negative for difficulty urinating, dysuria, flank pain, frequency, hematuria and urgency.  Musculoskeletal:  Negative for arthralgias, back pain, joint swelling, myalgias and neck stiffness.  Skin:  Negative for pallor and rash.  Neurological:  Negative for dizziness, speech difficulty, weakness and headaches.  Hematological:  Negative for adenopathy. Does not bruise/bleed easily.  Psychiatric/Behavioral:  Negative for confusion and sleep  disturbance. The patient is not nervous/anxious.     PE;    05/25/2022    9:14 AM 01/14/2022   10:41 AM 12/09/2021    8:53 AM  Vitals with BMI  Height 5' 9.5" '5\' 9"'$    Weight 188 lbs 181 lbs 13 oz   BMI 67.12 45.80   Systolic 998 338 99  Diastolic 80 82 60  Pulse 60 66 57   Exam chaperoned by Deveron Furlong, CMA.  Gen: Alert, well appearing.  Patient is oriented to person, place, time, and situation. AFFECT: pleasant, lucid thought and speech. ENT: Ears: EACs clear, normal epithelium.  TMs with good light reflex and landmarks bilaterally.  Eyes: no injection, icteris, swelling, or exudate.  EOMI, PERRLA. Nose: no drainage or turbinate edema/swelling.  No injection or focal lesion.  Mouth: lips without lesion/swelling.  Oral mucosa pink and moist.  Dentition intact and without obvious caries or gingival swelling.  Oropharynx without erythema, exudate, or swelling.  Neck: supple/nontender.  No LAD, mass, or TM.  Carotid pulses 2+ bilaterally, without bruits. CV: RRR, no m/r/g.   LUNGS: CTA bilat, nonlabored resps, good aeration in all lung fields. ABD: soft, NT, ND, BS normal.  No hepatospenomegaly or mass.  No bruits. EXT: no clubbing, cyanosis, or edema.  Musculoskeletal: no joint swelling, erythema, warmth, or tenderness.  ROM of all joints intact. Skin - no sores or suspicious lesions or rashes or color changes  Pertinent labs:  Lab Results  Component Value Date   TSH 0.89 05/19/2021   Lab Results  Component Value Date   WBC 8.5 05/19/2021   HGB 12.7 05/19/2021   HCT 38.8 05/19/2021   MCV 91.4 05/19/2021   PLT 290.0 05/19/2021   Lab Results  Component Value Date   CREATININE 0.62 05/19/2021   BUN 12 05/19/2021   NA 138 05/19/2021   K 4.2 05/19/2021   CL 106 05/19/2021   CO2 23 05/19/2021   Lab Results  Component Value Date   ALT 9 05/19/2021   AST 15 05/19/2021   ALKPHOS 35 (L) 05/19/2021   BILITOT 0.5 05/19/2021   Lab Results  Component Value Date   CHOL  187 05/19/2021   Lab Results  Component Value Date   HDL 68.70 05/19/2021   Lab Results  Component Value Date   LDLCALC 99 05/19/2021   Lab Results  Component Value Date   TRIG 96.0 05/19/2021   Lab Results  Component Value Date   CHOLHDL 3 05/19/2021   ASSESSMENT AND PLAN:    Health maintenance exam: Reviewed age and gender appropriate health maintenance issues (prudent diet, regular exercise, health risks of tobacco and excessive alcohol, use of seatbelts, fire alarms in home, use of sunscreen).  Also reviewed age and gender appropriate health screening as well as vaccine recommendations. Vaccines: Tdap UTD.  Labs: fasting HP Cervical ca screening: per GYN MD. Breast ca screening: normal mammogram 02/2022. Colon ca screening: recall 2028  An After Visit Summary was printed and given to the patient.  FOLLOW UP:  No follow-ups on file.  Signed:  Crissie Sickles, MD           05/25/2022

## 2022-05-31 ENCOUNTER — Telehealth: Payer: Self-pay

## 2022-05-31 NOTE — Telephone Encounter (Signed)
Emma from Dr. Brynda Greathouse office called regarding request for medical records they have received from our office.  Dr. Bryn[facility] Greathouse office has Epic.  Mammogram results are under imaging date 03/02/22.  Pap results are under labs date 03/18/21.  Please let them know if they can assist any further.

## 2022-05-31 NOTE — Telephone Encounter (Signed)
We have the results. Nothing further needed

## 2022-10-06 ENCOUNTER — Encounter: Payer: Self-pay | Admitting: Obstetrics & Gynecology

## 2022-10-06 ENCOUNTER — Other Ambulatory Visit (HOSPITAL_COMMUNITY)
Admission: RE | Admit: 2022-10-06 | Discharge: 2022-10-06 | Disposition: A | Discharge status: Home / Self Care | Payer: 59 | Source: Ambulatory Visit | Attending: Obstetrics & Gynecology | Admitting: Obstetrics & Gynecology

## 2022-10-06 ENCOUNTER — Ambulatory Visit (INDEPENDENT_AMBULATORY_CARE_PROVIDER_SITE_OTHER): Payer: 59 | Admitting: Obstetrics & Gynecology

## 2022-10-06 VITALS — BP 126/83 | HR 71 | Ht 69.0 in | Wt 193.0 lb

## 2022-10-06 DIAGNOSIS — N951 Menopausal and female climacteric states: Secondary | ICD-10-CM

## 2022-10-06 DIAGNOSIS — Z01419 Encounter for gynecological examination (general) (routine) without abnormal findings: Secondary | ICD-10-CM | POA: Insufficient documentation

## 2022-10-06 MED ORDER — CONJ ESTROGENS-BAZEDOXIFENE 0.45-20 MG PO TABS: 1.0000 | ORAL_TABLET | Freq: Every day | ORAL | 3 refills | Status: DC

## 2022-10-06 NOTE — Progress Notes (Signed)
Subjective:     Stacey Walton is a 47 y.o. female here for a routine exam.  No LMP recorded. Patient is perimenopausal. G1P0101 Birth Control Method:  perimenopausal Menstrual Calendar(currently): irregular  Current complaints: VMS/insomnia.   Current acute medical issues:  none   Recent Gynecologic History No LMP recorded. Patient is perimenopausal. Last Pap: 02/2021,  normal Last mammogram: 6/24,  normal  Past Medical History:  Diagnosis Date   Anxiety and depression    Bartholin gland cyst    Recurrent (left)   Broken foot    Right foot   GAD (generalized anxiety disorder)    GERD (gastroesophageal reflux disease)    Headache disorder    Tension in neck, occiput, tense shoulders.  No migrainous features.  Takes motrin and/or tylenol and it helps usually.  MRI neck and brain in the past normal (2009)   High grade squamous intraepithelial cervical dysplasia 2016   Cervical conization: CIN 1.  Subsequent pap's NEG.   History of anemia    UTI (lower urinary tract infection)    Vulvar cyst    Excised 2012    Past Surgical History:  Procedure Laterality Date   CERVICAL CONIZATION W/BX N/A 08/19/2015   Path: CIN 1  Procedure: LASER CONIZATION OF THE CERVIX;  Surgeon: Florian Buff, MD;  Location: [facility];  Service: Gynecology;  Laterality: N/A;   CESAREAN SECTION     COLONOSCOPY  12/10/2021   2023 polyp x1, recall 5 yrs   COLONOSCOPY WITH PROPOFOL N/A 12/09/2021   Procedure: COLONOSCOPY WITH PROPOFOL;  Surgeon: Rogene Houston, MD;  [facility];  Service: Endoscopy;  Laterality: N/A;  1050   CYSTECTOMY  09/19/2010   Vulvar (Dr. Elonda Husky)   HOLMIUM LASER APPLICATION N/A 22/97/9892   Procedure: HOLMIUM LASER APPLICATION;  Surgeon: Florian Buff, MD;  Location: [facility];  Service: Gynecology;  Laterality: N/A;   POLYPECTOMY  12/09/2021   Procedure: POLYPECTOMY INTESTINAL;  Surgeon: Rogene Houston, MD;  [facility];  Service: Endoscopy;;    OB History      Gravida  1   Para  1   Term      Preterm  1   AB      Living  1      SAB      IAB      Ectopic      Multiple      Live Births  1           Social History   Socioeconomic History   Marital status: Married    Spouse name: Not on file   Number of children: Not on file   Years of education: Not on file   Highest education level: Not on file  Occupational History   Not on file  Tobacco Use   Smoking status: Never   Smokeless tobacco: Never  Vaping Use   Vaping Use: Never used  Substance and Sexual Activity   Alcohol use: Yes    Comment: rarely   Drug use: No   Sexual activity: Yes    Birth control/protection: Pill  Other Topics Concern   Not on file  Social History Narrative   Divorced, remarried, has one teenage son.   She has an associates degree in nursing and works at [employer].   No T/A/Ds.   No significant exercise.   Normal american diet.         Social Determinants of Health   Financial  Resource Strain: Low Risk  (10/06/2022)   Overall Financial Resource Strain (CARDIA)    Difficulty of Paying Living Expenses: Not hard at all  Food Insecurity: No Food Insecurity (10/06/2022)   Hunger Vital Sign    Worried About Running Out of Food in the Last Year: Never true    Ran Out of Food in the Last Year: Never true  Transportation Needs: No Transportation Needs (10/06/2022)   PRAPARE - Hydrologist (Medical): No    Lack of Transportation (Non-Medical): No  Physical Activity: Insufficiently Active (10/06/2022)   Exercise Vital Sign    Days of Exercise per Week: 1 day    Minutes of Exercise per Session: 20 min  Stress: Stress Concern Present (10/06/2022)   [place]    Feeling of Stress : To some extent  Social Connections: Moderately Integrated (10/06/2022)   Social Connection and Isolation Panel [NHANES]    Frequency of Communication with Friends and  Family: More than three times a week    Frequency of Social Gatherings with Friends and Family: Once a week    Attends Religious Services: More than 4 times per year    Active Member of [employer] or Organizations: No    Attends Music therapist: Never    Marital Status: Married    Family History  Problem Relation Age of Onset   Hypertension Father    Transient ischemic attack Father    Stroke Father    Heart attack Father      Current Outpatient Medications:    Conj Estrogens-Bazedoxifene 0.45-20 MG TABS, Take 1 tablet by mouth Walton. Take 1 tablet Walton, Disp: 90 tablet, Rfl: 3   Multiple Vitamin (MULTIVITAMIN WITH MINERALS) TABS tablet, Take 1 tablet by mouth at bedtime., Disp: , Rfl:    escitalopram (LEXAPRO) 20 MG tablet, Take 20 mg by mouth Walton. (Patient not taking: Reported on 10/06/2022), Disp: , Rfl:    JUNEL FE 1/20 1-20 MG-MCG tablet, TAKE 1 TABLET BY MOUTH EVERY DAY (Patient not taking: Reported on 10/06/2022), Disp: 84 tablet, Rfl: 3  Review of Systems  Review of Systems  Constitutional: Negative for fever, chills, weight loss, malaise/fatigue and diaphoresis.  HENT: Negative for hearing loss, ear pain, nosebleeds, congestion, sore throat, neck pain, tinnitus and ear discharge.   Eyes: Negative for blurred vision, double vision, photophobia, pain, discharge and redness.  Respiratory: Negative for cough, hemoptysis, sputum production, shortness of breath, wheezing and stridor.   Cardiovascular: Negative for chest pain, palpitations, orthopnea, claudication, leg swelling and PND.  Gastrointestinal: negative for abdominal pain. Negative for heartburn, nausea, vomiting, diarrhea, constipation, blood in stool and melena.  Genitourinary: Negative for dysuria, urgency, frequency, hematuria and flank pain.  Musculoskeletal: Negative for myalgias, back pain, joint pain and falls.  Skin: Negative for itching and rash.  Neurological: Negative for dizziness, tingling,  tremors, sensory change, speech change, focal weakness, seizures, loss of consciousness, weakness and headaches.  Endo/Heme/Allergies: Negative for environmental allergies and polydipsia. Does not bruise/bleed easily.  Psychiatric/Behavioral: Negative for depression, suicidal ideas, hallucinations, memory loss and substance abuse. The patient is not nervous/anxious and does not have insomnia.        Objective:  Blood pressure 126/83, pulse 71, height '5\' 9"'$  (1.753 m), weight 193 lb (87.5 kg).   Physical Exam  Vitals reviewed. Constitutional: She is oriented to person, place, and time. She appears well-developed and well-nourished.  HENT:  Head: Normocephalic and atraumatic.  Right Ear: External ear normal.  Left Ear: External ear normal.  Nose: Nose normal.  Mouth/Throat: Oropharynx is clear and moist.  Eyes: Conjunctivae and EOM are normal. Pupils are equal, round, and reactive to light. Right eye exhibits no discharge. Left eye exhibits no discharge. No scleral icterus.  Neck: Normal range of motion. Neck supple. No tracheal deviation present. No thyromegaly present.  Cardiovascular: Normal rate, regular rhythm, normal heart sounds and intact distal pulses.  Exam reveals no gallop and no friction rub.   No murmur heard. Respiratory: Effort normal and breath sounds normal. No respiratory distress. She has no wheezes. She has no rales. She exhibits no tenderness.  GI: Soft. Bowel sounds are normal. She exhibits no distension and no mass. There is no tenderness. There is no rebound and no guarding.  Genitourinary:  Breasts no masses skin changes or nipple changes bilaterally      Vulva is normal without lesions Vagina is pink moist without discharge Cervix normal in appearance and pap is done Uterus is normal size shape and contour Adnexa is negative with normal sized ovaries   Musculoskeletal: Normal range of motion. She exhibits no edema and no tenderness.  Neurological: She is  alert and oriented to person, place, and time. She has normal reflexes. She displays normal reflexes. No cranial nerve deficit. She exhibits normal muscle tone. Coordination normal.  Skin: Skin is warm and dry. No rash noted. No erythema. No pallor.  Psychiatric: She has a normal mood and affect. Her behavior is normal. Judgment and thought content normal.       Medications Ordered at today's visit: Meds ordered this encounter  Medications   Conj Estrogens-Bazedoxifene 0.45-20 MG TABS    Sig: Take 1 tablet by mouth Walton. Take 1 tablet Walton    Dispense:  90 tablet    Refill:  3    Other orders placed at today's visit: No orders of the defined types were placed in this encounter.     Assessment:    Normal Gyn exam.      ICD-10-CM   1. Well woman exam with routine gynecological exam  Z01.419     2. Encounter for gynecological examination with Papanicolaou smear of cervix  Z01.419 Cytology - PAP( )    3. Symptomatic menopausal or female climacteric states  N95.1    begin duavee Walton      Plan:    Hormone replacement therapy: begin duavee. Follow up in: 1 year.     Return in about 1 year (around 10/07/2023) for yearly.

## 2022-10-07 LAB — CYTOLOGY - PAP
Comment: NEGATIVE
Diagnosis: NEGATIVE
High risk HPV: NEGATIVE

## 2022-10-17 ENCOUNTER — Encounter: Payer: Self-pay | Admitting: Obstetrics & Gynecology

## 2022-10-18 ENCOUNTER — Other Ambulatory Visit (HOSPITAL_COMMUNITY): Payer: Self-pay

## 2022-10-19 MED ORDER — BIJUVA 1-100 MG PO CAPS: 1.0000 | ORAL_CAPSULE | Freq: Every day | ORAL | 3 refills | Status: DC

## 2022-10-19 NOTE — Addendum Note (Signed)
Addended by: Florian Buff on: 10/19/2022 11:16 AM   Modules accepted: Orders

## 2022-10-20 ENCOUNTER — Encounter: Payer: Self-pay | Admitting: Obstetrics & Gynecology

## 2022-10-21 MED ORDER — PROGESTERONE 200 MG PO CAPS: 200.0000 mg | ORAL_CAPSULE | Freq: Every day | ORAL | 3 refills | Status: DC

## 2022-10-21 MED ORDER — ESTRADIOL 0.1 MG/24HR TD PTTW: 1.0000 | MEDICATED_PATCH | TRANSDERMAL | 3 refills | Status: DC

## 2022-10-21 NOTE — Addendum Note (Signed)
Addended by: Florian Buff on: 10/21/2022 11:58 AM   Modules accepted: Orders

## 2022-10-25 MED ORDER — CONJ ESTROGENS-BAZEDOXIFENE 0.45-20 MG PO TABS: 1.0000 | ORAL_TABLET | Freq: Every day | ORAL | 11 refills | Status: DC

## 2022-10-25 NOTE — Addendum Note (Signed)
Addended by: Florian Buff on: 10/25/2022 09:06 AM   Modules accepted: Orders

## 2022-11-09 DIAGNOSIS — M25512 Pain in left shoulder: Secondary | ICD-10-CM | POA: Insufficient documentation

## 2022-12-01 ENCOUNTER — Encounter: Payer: Self-pay | Admitting: *Deleted

## 2022-12-06 ENCOUNTER — Encounter: Payer: Self-pay | Admitting: Obstetrics & Gynecology

## 2022-12-06 ENCOUNTER — Ambulatory Visit (INDEPENDENT_AMBULATORY_CARE_PROVIDER_SITE_OTHER): Payer: 59 | Admitting: Obstetrics & Gynecology

## 2022-12-06 VITALS — BP 131/77 | HR 69 | Ht 69.0 in | Wt 189.0 lb

## 2022-12-06 DIAGNOSIS — N751 Abscess of Bartholin's gland: Secondary | ICD-10-CM

## 2022-12-06 MED ORDER — SULFAMETHOXAZOLE-TRIMETHOPRIM 800-160 MG PO TABS
1.0000 | ORAL_TABLET | Freq: Two times a day (BID) | ORAL | 0 refills | Status: DC
Start: 2022-12-06 — End: 2023-02-01

## 2022-12-06 NOTE — Progress Notes (Signed)
    Pre op diagnosis:  paravaginal cyst, recurrent   Post op Diagnosis: SAA + purulent   Procedure:  I&D left paravaginal cyst   Surgeon:  Florian Buff, MD    Cystic mass 2 cm  was anesthetized with 4 cc 1% lidocain 11 blade used to make stab incision Drained copious purulent thick fluid Milked out all the fluid No communication Begin on bactrim ds     Florian Buff, MD 12/06/2022 2:23 PM

## 2023-01-24 ENCOUNTER — Other Ambulatory Visit (HOSPITAL_COMMUNITY): Payer: Self-pay | Admitting: Obstetrics & Gynecology

## 2023-01-24 DIAGNOSIS — Z1231 Encounter for screening mammogram for malignant neoplasm of breast: Secondary | ICD-10-CM

## 2023-02-01 ENCOUNTER — Encounter: Payer: Self-pay | Admitting: Family Medicine

## 2023-02-01 ENCOUNTER — Ambulatory Visit (INDEPENDENT_AMBULATORY_CARE_PROVIDER_SITE_OTHER): Payer: 59 | Admitting: Family Medicine

## 2023-02-01 VITALS — BP 135/88 | HR 56 | Wt 189.8 lb

## 2023-02-01 DIAGNOSIS — M25561 Pain in right knee: Secondary | ICD-10-CM

## 2023-02-01 DIAGNOSIS — M2550 Pain in unspecified joint: Secondary | ICD-10-CM

## 2023-02-01 DIAGNOSIS — G8929 Other chronic pain: Secondary | ICD-10-CM

## 2023-02-01 DIAGNOSIS — M25551 Pain in right hip: Secondary | ICD-10-CM

## 2023-02-01 DIAGNOSIS — M25562 Pain in left knee: Secondary | ICD-10-CM

## 2023-02-01 DIAGNOSIS — M25552 Pain in left hip: Secondary | ICD-10-CM

## 2023-02-01 MED ORDER — MELOXICAM 15 MG PO TABS: 15.0000 mg | ORAL_TABLET | Freq: Every day | ORAL | 0 refills | Status: AC

## 2023-02-01 NOTE — Progress Notes (Signed)
OFFICE VISIT  02/01/2023  CC:  Chief Complaint  Patient presents with   Joint Pain    Pt states pain is all over; mainly hip and knees. She has tried taking OTC meds to help with the pain.     Patient is a 47 y.o. female who presents for joint pain.  HPI: For about the last year Stacey Walton has noticed pain in both hips and both knees.  She has noted it become progressively more persistent and severe in the last 2 to 3 months. Points to anterior hip region and describes also difficulty laying on the side of her hips when trying to sleep at night. Also anterior aspect of knees hurt.  She does notice worse going up and down stairs. No swelling, redness, or warmth of any of the joints have been noted. No fever/night sweats, no abnormal weight loss, no fatigue, no rash, no oral ulcers.  No muscle aches.  She has tried increasing her walking and has not had much effect.  She takes ibuprofen or Aleve almost on a daily basis, sometimes twice a day.  It does help a significant amount. Of note, she has had some recent left shoulder pain that is suspected to be rotator cuff impingement, was seen by Dr. Karen Chafe and sports medicine February of this year.  This has been getting better gradually. She has had some bilateral plantar foot pain mainly over the heels lately.  No neck or back pain.  ROS as above, plus--> no CP, no SOB, no wheezing, no cough, no dizziness, no HAs, no melena/hematochezia.  No polyuria or polydipsia.   No focal weakness, paresthesias, or tremors.  No acute vision or hearing abnormalities.  No dysuria or unusual/new urinary urgency or frequency.  No recent changes in lower legs. No n/v/d or abd pain.  No palpitations.    Past Medical History:  Diagnosis Date   Anxiety and depression    Bartholin gland cyst    Recurrent (left)   Broken foot    Right foot   GAD (generalized anxiety disorder)    GERD (gastroesophageal reflux disease)    Headache disorder    Tension in neck,  occiput, tense shoulders.  No migrainous features.  Takes motrin and/or tylenol and it helps usually.  MRI neck and brain in the past normal (2009)   High grade squamous intraepithelial cervical dysplasia 2016   Cervical conization: CIN 1.  Subsequent pap's NEG.   History of anemia    UTI (lower urinary tract infection)    Vulvar cyst    Excised 2012    Past Surgical History:  Procedure Laterality Date   CERVICAL CONIZATION W/BX N/A 08/19/2015   Path: CIN 1  Procedure: LASER CONIZATION OF THE CERVIX;  Surgeon: Lazaro Arms, MD;  Location: [facility];  Service: Gynecology;  Laterality: N/A;   CESAREAN SECTION     COLONOSCOPY  12/10/2021   2023 polyp x1, recall 5 yrs   COLONOSCOPY WITH PROPOFOL N/A 12/09/2021   Procedure: COLONOSCOPY WITH PROPOFOL;  Surgeon: Malissa Hippo, MD;  Location: [facility];  Service: Endoscopy;  Laterality: N/A;  1050   CYSTECTOMY  09/19/2010   Vulvar (Dr. Despina Hidden)   HOLMIUM LASER APPLICATION N/A 08/19/2015   Procedure: HOLMIUM LASER APPLICATION;  Surgeon: Lazaro Arms, MD;  Location: [facility];  Service: Gynecology;  Laterality: N/A;   POLYPECTOMY  12/09/2021   Procedure: POLYPECTOMY INTESTINAL;  Surgeon: Malissa Hippo, MD;  Location: [facility];  Service: Endoscopy;;  Outpatient Medications Prior to Visit  Medication Sig Dispense Refill   Glucosamine HCl (GLUCOSAMINE PO) Take 2 tablets by mouth at bedtime.     MAGNESIUM PO Take 500 mg by mouth every morning.     TURMERIC CURCUMIN PO Take 2,250 mg by mouth at bedtime.     Conj Estrogens-Bazedoxifene 0.45-20 MG TABS Take 1 tablet by mouth daily. Take 1 tablet daily 30 tablet 11   Multiple Vitamin (MULTIVITAMIN WITH MINERALS) TABS tablet Take 1 tablet by mouth at bedtime.     escitalopram (LEXAPRO) 20 MG tablet Take 20 mg by mouth daily. (Patient not taking: Reported on 10/06/2022)     sulfamethoxazole-trimethoprim (BACTRIM DS) 800-160 MG tablet Take 1 tablet by mouth 2 (two) times daily. 14 tablet 0   No  facility-administered medications prior to visit.    No Known Allergies  Review of Systems  As per HPI  PE:    02/01/2023    8:19 AM 12/06/2022    1:36 PM 10/06/2022    8:44 AM  Vitals with BMI  Height  5\' 9"  5\' 9"   Weight 189 lbs 13 oz 189 lbs 193 lbs  BMI  27.9 28.49  Systolic 135 131 161  Diastolic 88 77 83  Pulse 56 69 71     Physical Exam  Gen: Alert, well appearing.  Patient is oriented to person, place, time, and situation. AFFECT: pleasant, lucid thought and speech. WRU:EAVW: no injection, icteris, swelling, or exudate.  EOMI, PERRLA. Mouth: lips without lesion/swelling.  Oral mucosa pink and moist. Oropharynx without erythema, exudate, or swelling.  CV: RRR, no m/r/g.   LUNGS: CTA bilat, nonlabored resps, good aeration in all lung fields. EXT: no clubbing or cyanosis.  no edema.  Musculoskeletal: no joint swelling, erythema, warmth, or tenderness.  ROM of all joints intact.  She has a little bit of discomfort in the lateral hips with range of motion testing.   LABS:  Last CBC Lab Results  Component Value Date   WBC 9.1 05/25/2022   HGB 12.9 05/25/2022   HCT 39.1 05/25/2022   MCV 90.6 05/25/2022   MCH 29.4 08/17/2015   RDW 13.5 05/25/2022   PLT 312.0 05/25/2022   Last metabolic panel Lab Results  Component Value Date   GLUCOSE 74 05/25/2022   NA 138 05/25/2022   K 4.4 05/25/2022   CL 105 05/25/2022   CO2 24 05/25/2022   BUN 12 05/25/2022   CREATININE 0.67 05/25/2022   GFRNONAA >60 08/17/2015   CALCIUM 9.0 05/25/2022   PROT 6.8 05/25/2022   ALBUMIN 3.9 05/25/2022   BILITOT 0.6 05/25/2022   ALKPHOS 50 05/25/2022   AST 15 05/25/2022   ALT 10 05/25/2022   ANIONGAP 9 08/17/2015   Last lipids Lab Results  Component Value Date   CHOL 213 (H) 05/25/2022   HDL 87.90 05/25/2022   LDLCALC 110 (H) 05/25/2022   TRIG 72.0 05/25/2022   CHOLHDL 2 05/25/2022   Last thyroid functions Lab Results  Component Value Date   TSH 0.91 05/25/2022    IMPRESSION AND PLAN:  Arthralgias, bilateral knees and bilateral hips. Low suspicion of inflammatory arthritis or connective tissue disease/autoimmune disease. She could have some osteoarthritis as well as some patellofemoral pain syndrome. At this point we decided to hold off on any lab or imaging workup.  Discussed possible rheumatology referral for specialist opinion but will hold off for now.  Will recheck in 2 months to see how she is doing.  An After Visit Summary  was printed and given to the patient.  FOLLOW UP: Return in about 2 months (around 04/03/2023) for Polyarthralgia.  Signed:  Santiago Bumpers, MD           02/01/2023

## 2023-03-06 ENCOUNTER — Ambulatory Visit (HOSPITAL_COMMUNITY)
Admission: RE | Admit: 2023-03-06 | Discharge: 2023-03-06 | Disposition: A | Discharge status: Home / Self Care | Payer: 59 | Source: Ambulatory Visit | Attending: Obstetrics & Gynecology | Admitting: Obstetrics & Gynecology

## 2023-03-06 DIAGNOSIS — Z1231 Encounter for screening mammogram for malignant neoplasm of breast: Secondary | ICD-10-CM | POA: Insufficient documentation

## 2023-05-04 ENCOUNTER — Encounter (INDEPENDENT_AMBULATORY_CARE_PROVIDER_SITE_OTHER): Payer: Self-pay

## 2023-05-30 ENCOUNTER — Encounter: Payer: 59 | Admitting: Family Medicine

## 2023-12-06 ENCOUNTER — Encounter: Payer: 59 | Attending: Family Medicine | Admitting: Dietician

## 2023-12-06 NOTE — Progress Notes (Signed)
 Medical Nutrition Therapy  Appointment Start time:  [phone]  Appointment End time:  1503  Primary concerns today: weight management  Referral diagnosis: employee visit Preferred learning style:   no preference indicated Learning readiness:  ready- change in progress)   NUTRITION ASSESSMENT  Clinical Medical Hx:  Past Medical History:  Diagnosis Date   Anxiety and depression    Bartholin gland cyst    Recurrent (left)   Broken foot    Right foot   GAD (generalized anxiety disorder)    GERD (gastroesophageal reflux disease)    Headache disorder    Tension in neck, occiput, tense shoulders.  No migrainous features.  Takes motrin and/or tylenol and it helps usually.  MRI neck and brain in the past normal (2009)   High grade squamous intraepithelial cervical dysplasia 2016   Cervical conization: CIN 1.  Subsequent pap's NEG.   History of anemia    UTI (lower urinary tract infection)    Vulvar cyst    Excised 2012    Medications:  Current Outpatient Medications:    MAGNESIUM PO, Take 500 mg by mouth every morning., Disp: , Rfl:    Conj Estrogens-Bazedoxifene 0.45-20 MG TABS, Take 1 tablet by mouth daily. Take 1 tablet daily (Patient not taking: Reported on 12/06/2023), Disp: 30 tablet, Rfl: 11   Glucosamine HCl (GLUCOSAMINE PO), Take 2 tablets by mouth at bedtime. (Patient not taking: Reported on 12/06/2023), Disp: , Rfl:    meloxicam (MOBIC) 15 MG tablet, Take 1 tablet (15 mg total) by mouth daily. (Patient not taking: Reported on 12/06/2023), Disp: 30 tablet, Rfl: 0   Multiple Vitamin (MULTIVITAMIN WITH MINERALS) TABS tablet, Take 1 tablet by mouth at bedtime. (Patient not taking: Reported on 12/06/2023), Disp: , Rfl:    TURMERIC CURCUMIN PO, Take 2,250 mg by mouth at bedtime. (Patient not taking: Reported on 12/06/2023), Disp: , Rfl:    Labs: Lab Results  Component Value Date   CHOL 213 (H) 05/25/2022   HDL 87.90 05/25/2022   LDLCALC 110 (H) 05/25/2022   TRIG 72.0 05/25/2022    CHOLHDL 2 05/25/2022   No results found for: "HGBA1C" BP Readings from Last 3 Encounters:  02/01/23 135/88  12/06/22 131/77  10/06/22 126/83   Lifestyle & Dietary Hx Pt presents today alone. Pt reports a desire to promote weight reductions to a lower BMI range. Pt reports weight reductions in the past yet followed by weight regain. Pt reports she discontinued regular soda about two months. Pt reports her appetite is fairly controled. 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    Estimated daily fluid intake:  48- 64 oz Supplements: melatonin Sleep: 6-7 hours nightly; interrupted Stress / self-care: 3 out of 10 / gardening Current average weekly physical activity: twice weekly weights, walks at work  24-Hr Dietary Recall First Meal: Bacon, sausage, cheese omelet, hash brown, white toast, coffee with stevia SF creamer Snack: slim jim Second Meal: keto wrap, [place], spinach, pickles, banana peppers, onions cheese or chicken, chopped salad with oil and vinegar or leftovers or skips 3 d/w  Snack: slim jim Third Meal: steak, grilled or roasted broccoli, cauliflower or black beans, corn, peppers, black eye peas,  tortilla chips  Snack: none Beverages: water, coffee with stevia SF creamer, hot tea with stevia  NUTRITION DIAGNOSIS  NB-1.1 Food and nutrition-related knowledge deficit As related to no prior nutrition related education.  As evidenced by Pt's reports and dietary recall.  NUTRITION INTERVENTION  Nutrition education (E-1) on the following topics:  Fruits & Vegetables: Aim to fill half your plate with a variety of fruits and vegetables. They are rich in vitamins, minerals, and fiber, and can help reduce the risk of chronic diseases. Choose a colorful assortment of fruits and vegetables to ensure you get a wide range of nutrients. Grains and Starches: Make at least half of your grain choices whole grains, such as brown rice, whole wheat  bread, and oats. Whole grains provide fiber, which aids in digestion and healthy cholesterol levels. Aim for whole forms of starchy vegetables such as potatoes, sweet potatoes, beans, peas, and corn, which are fiber rich and provide many vitamins and minerals.  Protein: Incorporate lean sources of protein, such as poultry, fish, beans, nuts, and seeds, into your meals. Protein is essential for building and repairing tissues, staying full, balancing blood sugar, as well as supporting immune function. Dairy: Include low-fat or fat-free dairy products like milk, yogurt, and cheese in your diet. Dairy foods are excellent sources of calcium and vitamin D, which are crucial for bone health.  Physical Activity: Aim for 60 minutes of physical activity daily. Regular physical activity promotes overall health-including helping to reduce risk for heart disease and diabetes, promoting mental health, and helping [place] sleep better.    Handouts Provided Include  Plate planner TEFL teacher)  Snack List   Learning Style & Readiness for Change Teaching method utilized: Visual & Auditory  Demonstrated degree of understanding via: Teach Back  Barriers to learning/adherence to lifestyle change: none  Goals Established by Pt Increase physical activity  Increase water intake   MONITORING & EVALUATION Dietary intake, weekly physical activity, and water intake   Next Steps  Patient is to return PRN

## 2024-01-24 ENCOUNTER — Ambulatory Visit: Admitting: Dietician

## 2024-03-08 ENCOUNTER — Other Ambulatory Visit (HOSPITAL_COMMUNITY): Payer: Self-pay | Admitting: Obstetrics & Gynecology

## 2024-03-08 DIAGNOSIS — Z1231 Encounter for screening mammogram for malignant neoplasm of breast: Secondary | ICD-10-CM

## 2024-03-15 ENCOUNTER — Ambulatory Visit (HOSPITAL_COMMUNITY)
Admission: RE | Admit: 2024-03-15 | Discharge: 2024-03-15 | Disposition: A | Discharge status: Home / Self Care | Source: Ambulatory Visit | Attending: Obstetrics & Gynecology | Admitting: Obstetrics & Gynecology

## 2024-03-15 DIAGNOSIS — Z1231 Encounter for screening mammogram for malignant neoplasm of breast: Secondary | ICD-10-CM | POA: Diagnosis not present

## 2024-03-26 ENCOUNTER — Ambulatory Visit: Payer: Self-pay | Admitting: Obstetrics & Gynecology

## 2024-05-09 ENCOUNTER — Other Ambulatory Visit (HOSPITAL_COMMUNITY)
Admission: RE | Admit: 2024-05-09 | Discharge: 2024-05-09 | Disposition: A | Discharge status: Home / Self Care | Source: Ambulatory Visit | Attending: Obstetrics & Gynecology | Admitting: Obstetrics & Gynecology

## 2024-05-09 ENCOUNTER — Encounter: Payer: Self-pay | Admitting: Obstetrics & Gynecology

## 2024-05-09 ENCOUNTER — Other Ambulatory Visit (HOSPITAL_COMMUNITY): Payer: Self-pay

## 2024-05-09 ENCOUNTER — Ambulatory Visit: Admitting: Obstetrics & Gynecology

## 2024-05-09 VITALS — BP 116/78 | HR 73 | Ht 69.0 in | Wt 185.0 lb

## 2024-05-09 DIAGNOSIS — Z01419 Encounter for gynecological examination (general) (routine) without abnormal findings: Secondary | ICD-10-CM

## 2024-05-09 DIAGNOSIS — Z87410 Personal history of cervical dysplasia: Secondary | ICD-10-CM | POA: Diagnosis not present

## 2024-05-09 DIAGNOSIS — Z124 Encounter for screening for malignant neoplasm of cervix: Secondary | ICD-10-CM | POA: Diagnosis not present

## 2024-05-09 MED ORDER — CONJ ESTROGENS-BAZEDOXIFENE 0.45-20 MG PO TABS
1.0000 | ORAL_TABLET | Freq: Every day | ORAL | 3 refills | Status: AC
  Filled 2024-05-09: qty 90, 90d supply, fill #0

## 2024-05-09 NOTE — Progress Notes (Signed)
 Subjective:     Stacey Walton is a 48 y.o. female here for a routine exam.  Patient's last menstrual period was 04/12/2024 (exact date). G1P0101 Birth Control Method:  post menopausal Menstrual Calendar(currently): amenorrhea  Current complaints: none.   Current acute medical issues:  none   Recent Gynecologic History Patient's last menstrual period was 04/12/2024 (exact date). Last Pap: 09/2022,  normal Last mammogram: 2025,  normal  Past Medical History:  Diagnosis Date   Anxiety and depression    Bartholin gland cyst    Recurrent (left)   Broken foot    Right foot   GAD (generalized anxiety disorder)    GERD (gastroesophageal reflux disease)    Headache disorder    Tension in neck, occiput, tense shoulders.  No migrainous features.  Takes motrin and/or tylenol  and it helps usually.  MRI neck and brain in the past normal (2009)   High grade squamous intraepithelial cervical dysplasia 2016   Cervical conization: CIN 1.  Subsequent pap's NEG.   History of anemia    UTI (lower urinary tract infection)    Vulvar cyst    Excised 2012    Past Surgical History:  Procedure Laterality Date   CERVICAL CONIZATION W/BX N/A 08/19/2015   Path: CIN 1  Procedure: LASER CONIZATION OF THE CERVIX;  Surgeon: Vonn VEAR Inch, MD;  Location: [facility];  Service: Gynecology;  Laterality: N/A;   CESAREAN SECTION     COLONOSCOPY  12/10/2021   2023 polyp x1, recall 5 yrs   COLONOSCOPY WITH PROPOFOL  N/A 12/09/2021   Procedure: COLONOSCOPY WITH PROPOFOL ;  Surgeon: Golda Claudis PENNER, MD;  Location: [facility];  Service: Endoscopy;  Laterality: N/A;  1050   CYSTECTOMY  09/19/2010   Vulvar (Dr. Inch)   HOLMIUM LASER APPLICATION N/A 08/19/2015   Procedure: HOLMIUM LASER APPLICATION;  Surgeon: Vonn VEAR Inch, MD;  Location: [facility];  Service: Gynecology;  Laterality: N/A;   POLYPECTOMY  12/09/2021   Procedure: POLYPECTOMY INTESTINAL;  Surgeon: Golda Claudis PENNER, MD;  Location: [facility];  Service:  Endoscopy;;    OB History     Gravida  1   Para  1   Term      Preterm  1   AB      Living  1      SAB      IAB      Ectopic      Multiple      Live Births  1           Social History   Socioeconomic History   Marital status: Married    Spouse name: Not on file   Number of children: Not on file   Years of education: Not on file   Highest education level: Master's degree (e.g., MA, MS, MEng, MEd, MSW, MBA)  Occupational History   Not on file  Tobacco Use   Smoking status: Never   Smokeless tobacco: Never  Vaping Use   Vaping status: Never Used  Substance and Sexual Activity   Alcohol use: Yes    Comment: rarely   Drug use: No   Sexual activity: Yes    Birth control/protection: None  Other Topics Concern   Not on file  Social History Narrative   Divorced, remarried, has one teenage son.   She has an associates degree in nursing and works at [employer].   No T/A/Ds.   No significant exercise.   Normal american diet.  Social Drivers of Corporate investment banker Strain: Low Risk  (05/09/2024)   Overall Financial Resource Strain (CARDIA)    Difficulty of Paying Living Expenses: Not hard at all  Food Insecurity: No Food Insecurity (05/09/2024)   Hunger Vital Sign    Worried About Running Out of Food in the Last Year: Never true    Ran Out of Food in the Last Year: Never true  Transportation Needs: No Transportation Needs (05/09/2024)   PRAPARE - Administrator, Civil Service (Medical): No    Lack of Transportation (Non-Medical): No  Physical Activity: Insufficiently Active (05/09/2024)   Exercise Vital Sign    Days of Exercise per Week: 1 day    Minutes of Exercise per Session: 20 min  Stress: No Stress Concern Present (05/09/2024)   [employer] of Occupational Health - Occupational Stress Questionnaire    Feeling of Stress: Only a little  Social Connections: Socially Integrated (05/09/2024)   Social Connection and  Isolation Panel    Frequency of Communication with Friends and Family: More than three times a week    Frequency of Social Gatherings with Friends and Family: Once a week    Attends Religious Services: More than 4 times per year    Active Member of [employer] or Organizations: Yes    Attends Engineer, structural: More than 4 times per year    Marital Status: Married    Family History  Problem Relation Age of Onset   Hypertension Father    Transient ischemic attack Father    Stroke Father    Heart attack Father    Alzheimer's disease Father      Current Outpatient Medications:    Conj Estrogens -Bazedoxifene 0.45-20 MG TABS, Take 1 tablet by mouth daily., Disp: 90 tablet, Rfl: 3   Glucosamine HCl (GLUCOSAMINE PO), Take 2 tablets by mouth at bedtime. (Patient not taking: Reported on 05/09/2024), Disp: , Rfl:    MAGNESIUM PO, Take 500 mg by mouth every morning. (Patient not taking: Reported on 05/09/2024), Disp: , Rfl:    meloxicam  (MOBIC ) 15 MG tablet, Take 1 tablet (15 mg total) by mouth daily. (Patient not taking: Reported on 05/09/2024), Disp: 30 tablet, Rfl: 0   Multiple Vitamin (MULTIVITAMIN WITH MINERALS) TABS tablet, Take 1 tablet by mouth at bedtime. (Patient not taking: Reported on 05/09/2024), Disp: , Rfl:    TURMERIC CURCUMIN PO, Take 2,250 mg by mouth at bedtime. (Patient not taking: Reported on 05/09/2024), Disp: , Rfl:   Review of Systems  Review of Systems  Constitutional: Negative for fever, chills, weight loss, malaise/fatigue and diaphoresis.  HENT: Negative for hearing loss, ear pain, nosebleeds, congestion, sore throat, neck pain, tinnitus and ear discharge.   Eyes: Negative for blurred vision, double vision, photophobia, pain, discharge and redness.  Respiratory: Negative for cough, hemoptysis, sputum production, shortness of breath, wheezing and stridor.   Cardiovascular: Negative for chest pain, palpitations, orthopnea, claudication, leg swelling and PND.   Gastrointestinal: negative for abdominal pain. Negative for heartburn, nausea, vomiting, diarrhea, constipation, blood in stool and melena.  Genitourinary: Negative for dysuria, urgency, frequency, hematuria and flank pain.  Musculoskeletal: Negative for myalgias, back pain, joint pain and falls.  Skin: Negative for itching and rash.  Neurological: Negative for dizziness, tingling, tremors, sensory change, speech change, focal weakness, seizures, loss of consciousness, weakness and headaches.  Endo/Heme/Allergies: Negative for environmental allergies and polydipsia. Does not bruise/bleed easily.  Psychiatric/Behavioral: Negative for depression, suicidal ideas, hallucinations, memory loss and substance  abuse. The patient is not nervous/anxious and does not have insomnia.        Objective:  Blood pressure 116/78, pulse 73, height 5' 9 (1.753 m), weight 185 lb (83.9 kg), last menstrual period 04/12/2024.   Physical Exam  Vitals reviewed. Constitutional: She is oriented to person, place, and time. She appears well-developed and well-nourished.  HENT:  Head: Normocephalic and atraumatic.        Right Ear: External ear normal.  Left Ear: External ear normal.  Nose: Nose normal.  Mouth/Throat: Oropharynx is clear and moist.  Eyes: Conjunctivae and EOM are normal. Pupils are equal, round, and reactive to light. Right eye exhibits no discharge. Left eye exhibits no discharge. No scleral icterus.  Neck: Normal range of motion. Neck supple. No tracheal deviation present. No thyromegaly present.  Cardiovascular: Normal rate, regular rhythm, normal heart sounds and intact distal pulses.  Exam reveals no gallop and no friction rub.   No murmur heard. Respiratory: Effort normal and breath sounds normal. No respiratory distress. She has no wheezes. She has no rales. She exhibits no tenderness.  GI: Soft. Bowel sounds are normal. She exhibits no distension and no mass. There is no tenderness. There is no  rebound and no guarding.  Genitourinary:  Breasts no masses skin changes or nipple changes bilaterally      Vulva is normal without lesions Vagina is pink moist without discharge Cervix normal in appearance and pap is done Uterus is normal size shape and contour Adnexa is negative with normal sized ovaries  Musculoskeletal: Normal range of motion. She exhibits no edema and no tenderness.  Neurological: She is alert and oriented to person, place, and time. She has normal reflexes. She displays normal reflexes. No cranial nerve deficit. She exhibits normal muscle tone. Coordination normal.  Skin: Skin is warm and dry. No rash noted. No erythema. No pallor.  Psychiatric: She has a normal mood and affect. Her behavior is normal. Judgment and thought content normal.       Medications Ordered at today's visit: Meds ordered this encounter  Medications   Conj Estrogens -Bazedoxifene 0.45-20 MG TABS    Sig: Take 1 tablet by mouth daily.    Dispense:  90 tablet    Refill:  3    Other orders placed at today's visit: No orders of the defined types were placed in this encounter.    ASSESSMENT + PLAN:    ICD-10-CM   1. Well woman exam with routine gynecological exam  Z01.419     2. Routine Papanicolaou smear  Z12.4 Cytology - PAP    3. History of cervical dysplasia, high grade, s/p laser ablation  Z87.410           Return in about 1 year (around 05/09/2025) for Follow up, with Dr Jayne.

## 2024-05-10 LAB — CYTOLOGY - PAP
Comment: NEGATIVE
Diagnosis: NEGATIVE
High risk HPV: NEGATIVE

## 2024-05-12 ENCOUNTER — Ambulatory Visit (HOSPITAL_COMMUNITY): Payer: Self-pay | Admitting: Obstetrics & Gynecology

## 2024-05-13 ENCOUNTER — Other Ambulatory Visit (HOSPITAL_COMMUNITY): Payer: Self-pay

## 2024-05-31 IMAGING — MG MM DIGITAL SCREENING BILAT W/ TOMO AND CAD
8 series · 8 of 24 positions shown · non-contrast
Comparison: Previous exam(s).

CLINICAL DATA: Screening.

EXAM:
DIGITAL SCREENING BILATERAL MAMMOGRAM WITH TOMOSYNTHESIS AND CAD
TECHNIQUE: Bilateral screening digital craniocaudal and mediolateral oblique
mammograms were obtained. Bilateral screening digital breast
tomosynthesis was performed. The images were evaluated with
computer-aided detection.

[R MLO synth-2D]
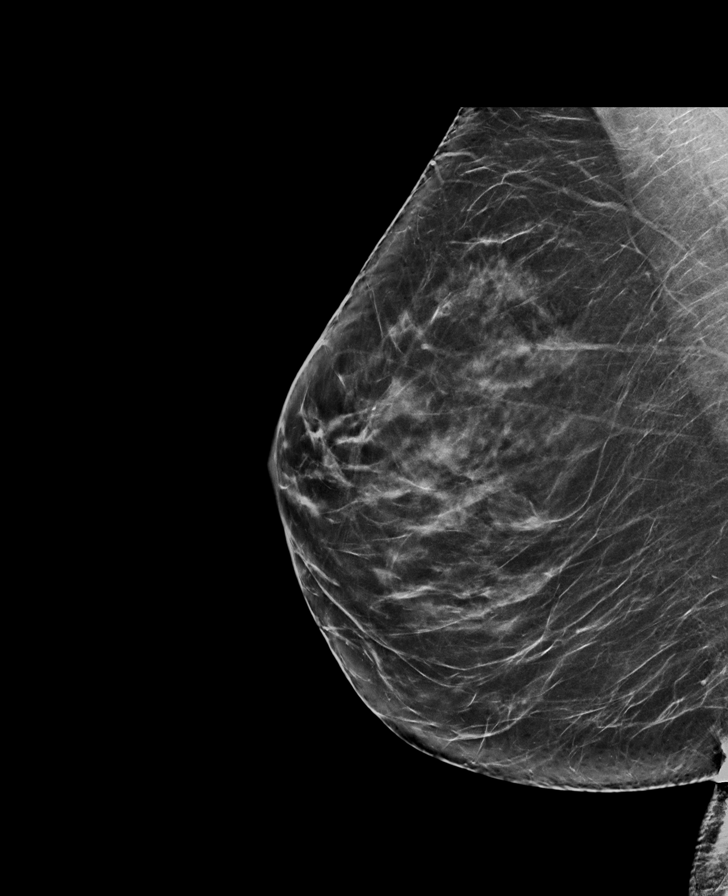

[L MLO synth-2D]
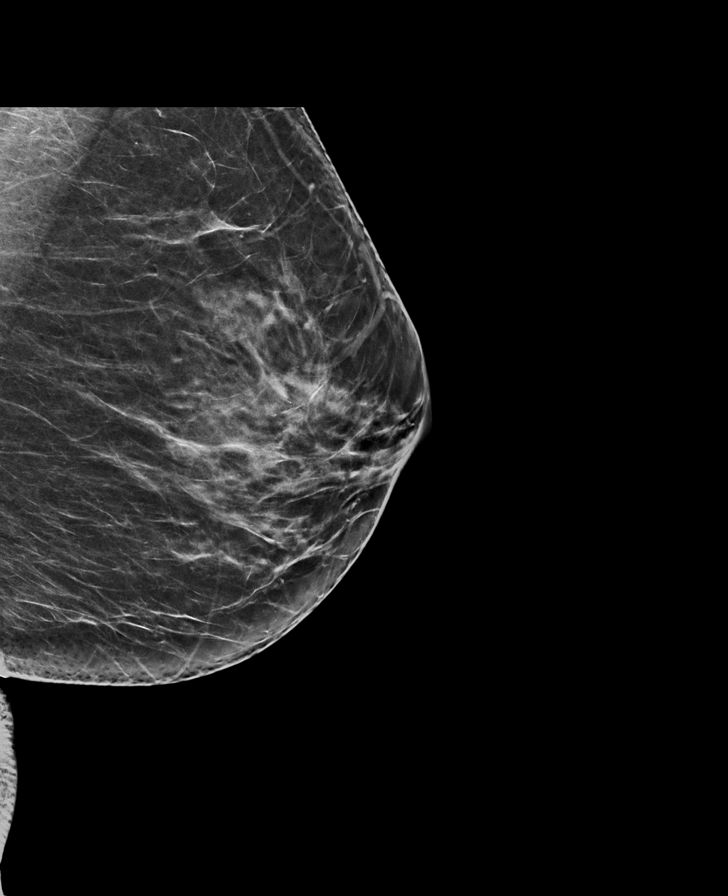

[R CC synth-2D]
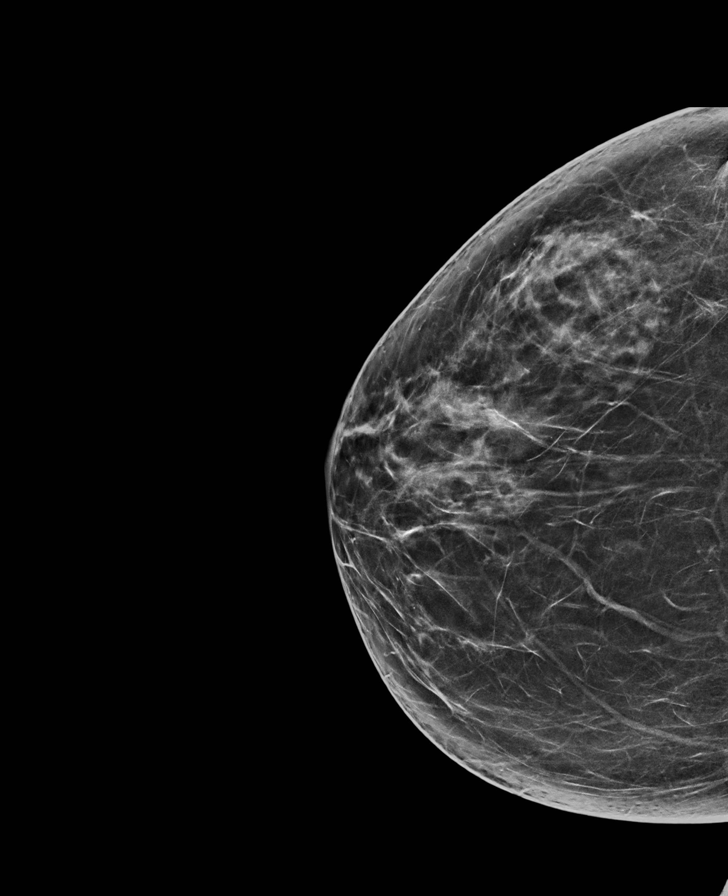

[L CC synth-2D]
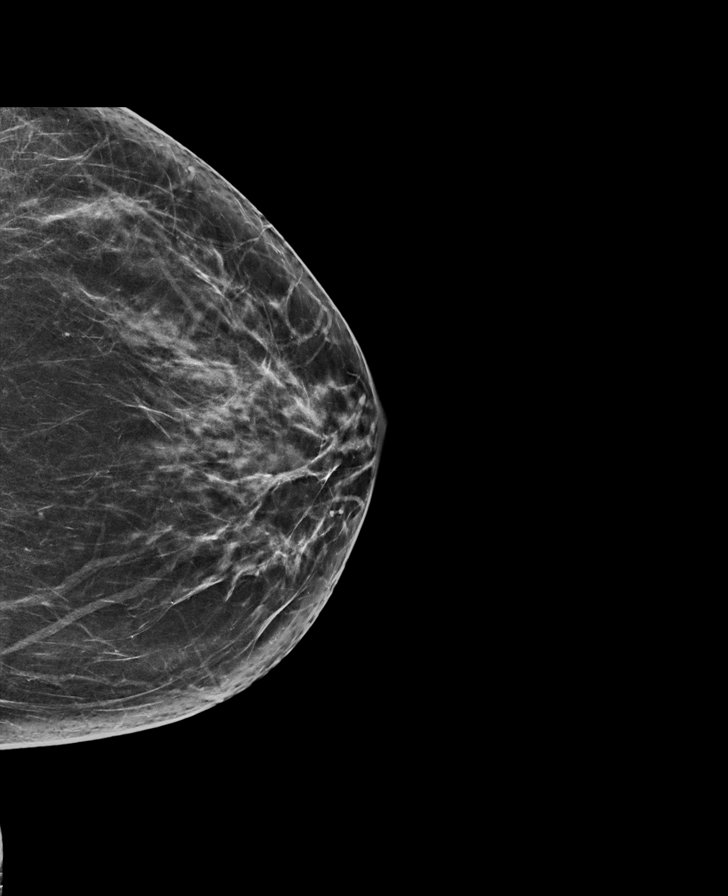

[L CC tomo · tomo slice 35/70.0]
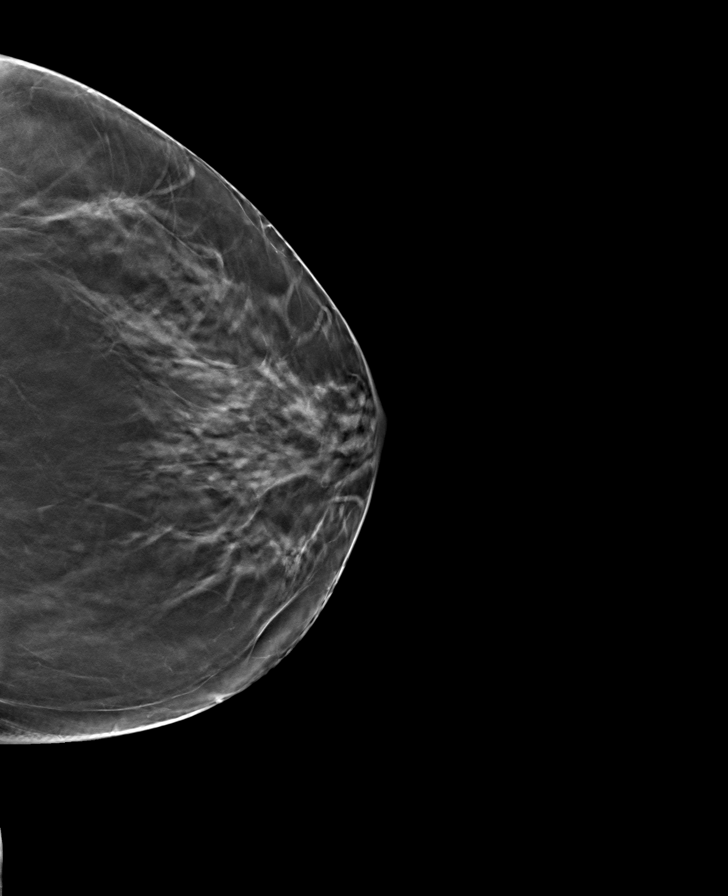

[R MLO tomo · tomo slice 39/77.0]
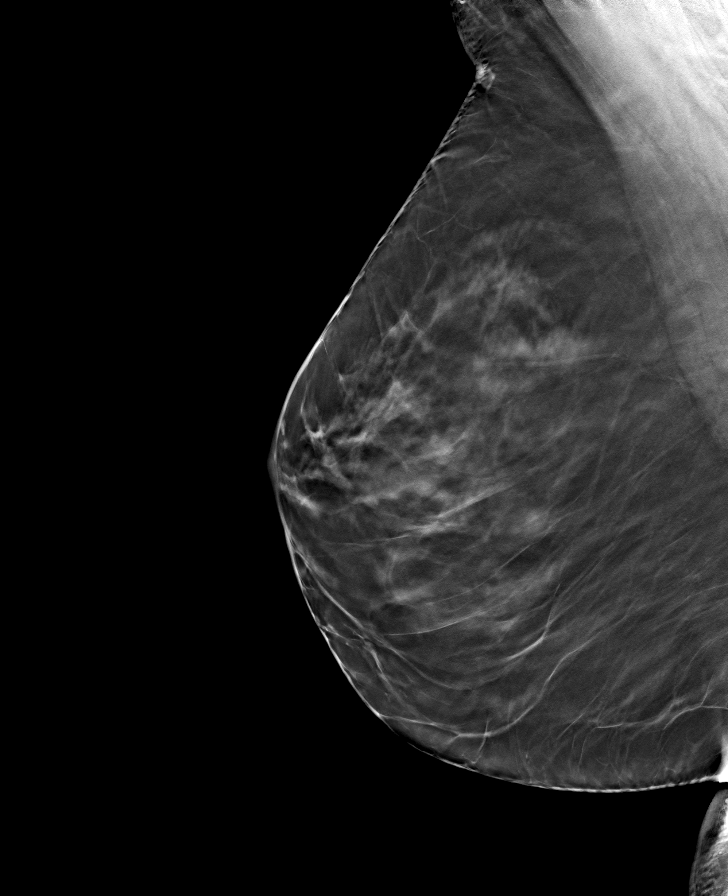

[R CC tomo · tomo slice 37/74.0]
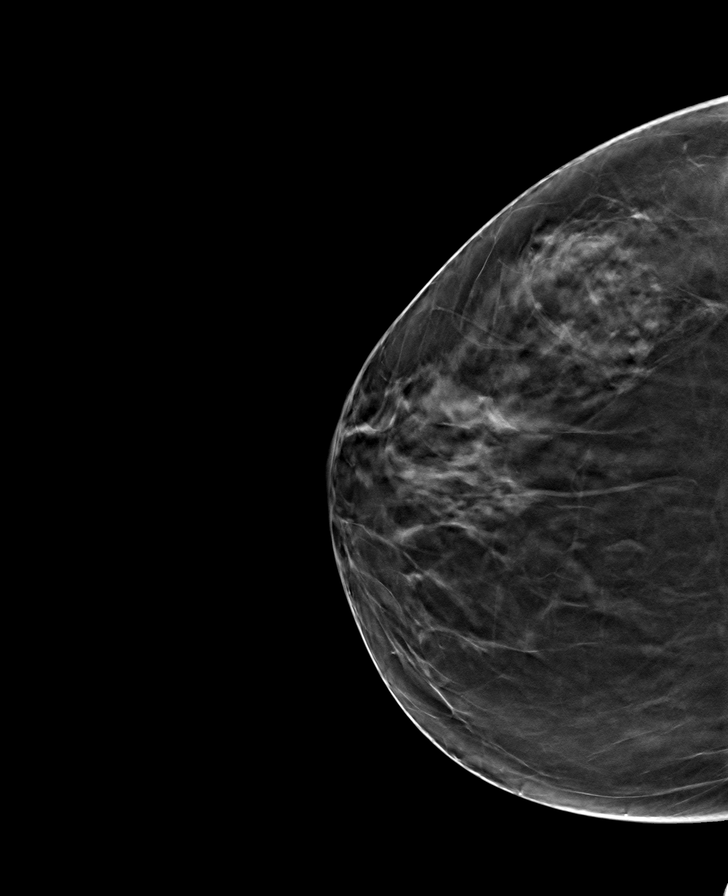

[L MLO tomo · tomo slice 35/70.0]
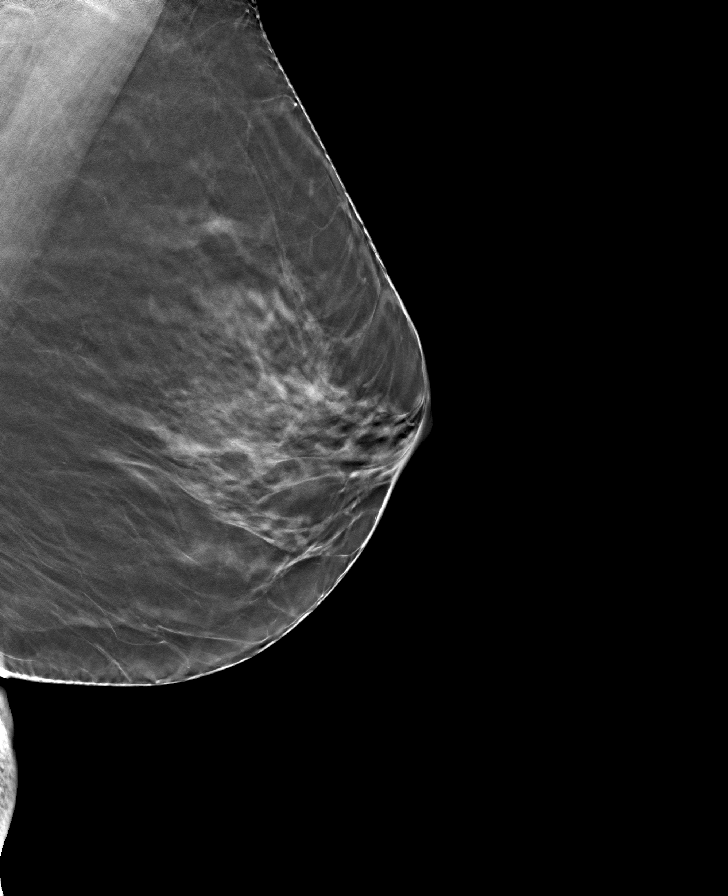

[8 of 24 positions shown; findings below may reference images not displayed]

ACR Breast Density Category b: There are scattered areas of
fibroglandular density.
FINDINGS: There are no findings suspicious for malignancy.
IMPRESSION: No mammographic evidence of malignancy. A result letter of this
screening mammogram will be mailed directly to the patient.

RECOMMENDATION:
Screening mammogram in one year. (Code:51-O-LD2)

BI-RADS CATEGORY  1: Negative.

## 2024-07-18 NOTE — Progress Notes (Signed)
 [facility] [address] [place] [address] Phone: [phone] Subjective:   Stacey Walton, am serving as a scribe for Dr. Arthea Claudene.  I'm seeing this patient by the request  of:  McGowen, Aleene DEL, MD  CC: Heel pain  YEP:Dlagzrupcz  [name] is a 48 y.o. female coming in with complaint of B heel pain. L>R. Patient states that she has had pain for past year. Painful to lie in bed with heels touching bed. Entire foot hurts though. Tried new shoes: Brooks, HOKA, [place]. Does go barefoot in the house.        Past Medical History:  Diagnosis Date   Anxiety and depression    Bartholin gland cyst    Recurrent (left)   Broken foot    Right foot   GAD (generalized anxiety disorder)    GERD (gastroesophageal reflux disease)    Headache disorder    Tension in neck, occiput, tense shoulders.  No migrainous features.  Takes motrin and/or tylenol  and it helps usually.  MRI neck and brain in the past normal (2009)   High grade squamous intraepithelial cervical dysplasia 2016   Cervical conization: CIN 1.  Subsequent pap's NEG.   History of anemia    UTI (lower urinary tract infection)    Vulvar cyst    Excised 2012   Past Surgical History:  Procedure Laterality Date   CERVICAL CONIZATION W/BX N/A 08/19/2015   Path: CIN 1  Procedure: LASER CONIZATION OF THE CERVIX;  Surgeon: Vonn DEL Inch, MD;  Location: [facility];  Service: Gynecology;  Laterality: N/A;   CESAREAN SECTION     COLONOSCOPY  12/10/2021   2023 polyp x1, recall 5 yrs   COLONOSCOPY WITH PROPOFOL  N/A 12/09/2021   Procedure: COLONOSCOPY WITH PROPOFOL ;  Surgeon: Golda Claudis PENNER, MD;  Location: [facility];  Service: Endoscopy;  Laterality: N/A;  1050   CYSTECTOMY  09/19/2010   Vulvar (Dr. Inch)   HOLMIUM LASER APPLICATION N/A 08/19/2015   Procedure: HOLMIUM LASER APPLICATION;  Surgeon: Vonn DEL Inch, MD;  Location: [facility];  Service: Gynecology;  Laterality: N/A;    POLYPECTOMY  12/09/2021   Procedure: POLYPECTOMY INTESTINAL;  Surgeon: Golda Claudis PENNER, MD;  Location: [facility];  Service: Endoscopy;;   Social History   Socioeconomic History   Marital status: Married    Spouse name: Not on file   Number of children: Not on file   Years of education: Not on file   Highest education level: Master's degree (e.g., MA, MS, MEng, MEd, MSW, MBA)  Occupational History   Not on file  Tobacco Use   Smoking status: Never   Smokeless tobacco: Never  Vaping Use   Vaping status: Never Used  Substance and Sexual Activity   Alcohol use: Yes    Comment: rarely   Drug use: No   Sexual activity: Yes    Birth control/protection: None  Other Topics Concern   Not on file  Social History Narrative   Divorced, remarried, has one teenage son.   She has an associates degree in nursing and works at [employer].   No T/A/Ds.   No significant exercise.   Normal american diet.         Social Drivers of Corporate Investment Banker Strain: Low Risk  (05/09/2024)   Overall Financial Resource Strain (CARDIA)    Difficulty of Paying Living Expenses: Not hard at all  Food Insecurity: No Food Insecurity (05/09/2024)  Hunger Vital Sign    Worried About Running Out of Food in the Last Year: Never true    Ran Out of Food in the Last Year: Never true  Transportation Needs: No Transportation Needs (05/09/2024)   PRAPARE - Administrator, Civil Service (Medical): No    Lack of Transportation (Non-Medical): No  Physical Activity: Insufficiently Active (05/09/2024)   Exercise Vital Sign    Days of Exercise per Week: 1 day    Minutes of Exercise per Session: 20 min  Stress: No Stress Concern Present (05/09/2024)   [employer] of Occupational Health - Occupational Stress Questionnaire    Feeling of Stress: Only a little  Social Connections: Socially Integrated (05/09/2024)   Social Connection and Isolation Panel    Frequency of Communication with Friends and  Family: More than three times a week    Frequency of Social Gatherings with Friends and Family: Once a week    Attends Religious Services: More than 4 times per year    Active Member of [employer] or Organizations: Yes    Attends Engineer, Structural: More than 4 times per year    Marital Status: Married   No Known Allergies Family History  Problem Relation Age of Onset   Hypertension Father    Transient ischemic attack Father    Stroke Father    Heart attack Father    Alzheimer's disease Father     Current Outpatient Medications (Endocrine & Metabolic):    Conj Estrogens -Bazedoxifene 0.45-20 MG TABS, Take 1 tablet by mouth daily.    Current Outpatient Medications (Analgesics):    meloxicam  (MOBIC ) 15 MG tablet, Take 1 tablet (15 mg total) by mouth daily.   Current Outpatient Medications (Other):    Glucosamine HCl (GLUCOSAMINE PO), Take 2 tablets by mouth at bedtime.   MAGNESIUM PO, Take 500 mg by mouth every morning.   Multiple Vitamin (MULTIVITAMIN WITH MINERALS) TABS tablet, Take 1 tablet by mouth at bedtime.   TURMERIC CURCUMIN PO, Take 2,250 mg by mouth at bedtime.   Reviewed prior external information including notes and imaging from  primary care provider As well as notes that were available from care everywhere and other healthcare systems.  Past medical history, social, surgical and family history all reviewed in electronic medical record.  No pertanent information unless stated regarding to the chief complaint.   Review of Systems:  No headache, visual changes, nausea, vomiting, diarrhea, constipation, dizziness, abdominal pain, skin rash, fevers, chills, night sweats, weight loss, swollen lymph nodes, body aches, joint swelling, chest pain, shortness of breath, mood changes. POSITIVE muscle aches  Objective  Blood pressure (!) 138/90, pulse (!) 57, height 5' 9 (1.753 m), weight 184 lb (83.5 kg), SpO2 97%.   General: No apparent distress alert and oriented  x3 mood and affect normal, dressed appropriately.  HEENT: Pupils equal, extraocular movements intact  Respiratory: Patient's speak in full sentences and does not appear short of breath  Cardiovascular: No lower extremity edema, non tender, no erythema  Heel exam shows nontender on exam today.  Some mild tenderness more over the posterior tibialis tendon left greater than right.  Negative Tinel's noted today.  Breakdown of the longitudinal arch with overpronation improved on the transverse arch with mild splaying between the 1st and 2nd toes  Limited muscular skeletal ultrasound was performed and interpreted by CLAUDENE HUSSAR, M  Limited ultrasound shows some hypoechoic changes around the posterior tibialis tendon.  Plantar fascia is completely unremarkable.  Patient's  tarsal tunnel is unremarkable.  Achilles is unremarkable Impression: Posterior tibialis tendinitis  [id number]; 15 additional minutes spent for Therapeutic exercises as stated in above notes.  This included exercises focusing on stretching, strengthening, with significant focus on eccentric aspects.   Long term goals include an improvement in range of motion, strength, endurance as well as avoiding reinjury. Patient's frequency would include in 1-2 times a day, 3-5 times a week for a duration of 6-12 weeks.  Ankle strengthening that included:  Basic range of motion exercises to allow proper full motion at ankle Stretching of the lower leg and hamstrings  Theraband exercises for the lower leg - inversion, eversion, dorsiflexion and plantarflexion each to be completed with a theraband Balance exercises to increase proprioception Weight bearing exercises to increase strength and balance Proper technique shown and discussed handout in great detail with ATC.  All questions were discussed and answered.      Impression and Recommendations:    The above documentation has been reviewed and is accurate and complete Ayuub Penley M Kanoelani Dobies, DO

## 2024-07-22 ENCOUNTER — Other Ambulatory Visit: Payer: Self-pay

## 2024-07-22 ENCOUNTER — Encounter: Payer: Self-pay | Admitting: Family Medicine

## 2024-07-22 ENCOUNTER — Ambulatory Visit: Admitting: Family Medicine

## 2024-07-22 VITALS — BP 138/90 | HR 57 | Ht 69.0 in | Wt 184.0 lb

## 2024-07-22 DIAGNOSIS — M216X9 Other acquired deformities of unspecified foot: Secondary | ICD-10-CM | POA: Diagnosis not present

## 2024-07-22 DIAGNOSIS — M79672 Pain in left foot: Secondary | ICD-10-CM

## 2024-07-22 DIAGNOSIS — M76821 Posterior tibial tendinitis, right leg: Secondary | ICD-10-CM | POA: Insufficient documentation

## 2024-07-22 DIAGNOSIS — M76822 Posterior tibial tendinitis, left leg: Secondary | ICD-10-CM

## 2024-07-22 NOTE — Assessment & Plan Note (Signed)
 Transverse arch breakdown noted.  Discussed metatarsal pads and over-the-counter orthotics.

## 2024-07-22 NOTE — Assessment & Plan Note (Signed)
 Bilateral with transmetatarsal is well noted.  We discussed proper sandals, or over-the-counter orthotics, we discussed home exercises, do believe the patient is good.  Continue to monitor.  Discussed icing regimen and will patient work with event organiser.  Follow-up with me in 3 months otherwise.

## 2024-07-22 NOTE — Patient Instructions (Signed)
 Posterior tib Recovery sandals in house Spenco total Support Orthotics See me in 2 months

## 2024-08-07 ENCOUNTER — Ambulatory Visit: Admitting: Family Medicine

## 2024-09-03 DIAGNOSIS — S233XXA Sprain of ligaments of thoracic spine, initial encounter: Secondary | ICD-10-CM | POA: Diagnosis not present

## 2024-09-03 DIAGNOSIS — S161XXA Strain of muscle, fascia and tendon at neck level, initial encounter: Secondary | ICD-10-CM | POA: Diagnosis not present

## 2024-09-03 DIAGNOSIS — S338XXA Sprain of other parts of lumbar spine and pelvis, initial encounter: Secondary | ICD-10-CM | POA: Diagnosis not present

## 2024-09-26 ENCOUNTER — Ambulatory Visit: Admitting: Family Medicine
